# Patient Record
Sex: Male | Born: 1965 | Race: White | Hispanic: No | Marital: Married | Smoking: Current some day smoker
Health system: Southern US, Community
[De-identification: ages and names within clinical notes are randomized; demographics above are authoritative.]

## PROBLEM LIST (undated history)

## (undated) DIAGNOSIS — M549 Dorsalgia, unspecified: Secondary | ICD-10-CM

## (undated) DIAGNOSIS — Z9189 Other specified personal risk factors, not elsewhere classified: Secondary | ICD-10-CM

## (undated) DIAGNOSIS — Z95828 Presence of other vascular implants and grafts: Secondary | ICD-10-CM

## (undated) DIAGNOSIS — J301 Allergic rhinitis due to pollen: Secondary | ICD-10-CM

## (undated) DIAGNOSIS — IMO0002 Reserved for concepts with insufficient information to code with codable children: Secondary | ICD-10-CM

## (undated) DIAGNOSIS — G4700 Insomnia, unspecified: Secondary | ICD-10-CM

## (undated) DIAGNOSIS — K750 Abscess of liver: Secondary | ICD-10-CM

## (undated) HISTORY — DX: Allergic rhinitis due to pollen: J30.1

## (undated) HISTORY — DX: Insomnia, unspecified: G47.00

## (undated) HISTORY — DX: Dorsalgia, unspecified: M54.9

## (undated) HISTORY — DX: Presence of other vascular implants and grafts: Z95.828

## (undated) HISTORY — DX: Other specified personal risk factors, not elsewhere classified: Z91.89

---
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
## 2001-05-24 ENCOUNTER — Encounter: Payer: Self-pay | Admitting: Family Medicine

## 2001-05-24 ENCOUNTER — Ambulatory Visit (HOSPITAL_COMMUNITY): Admission: RE | Admit: 2001-05-24 | Discharge: 2001-05-24 | Payer: Self-pay | Admitting: Family Medicine

## 2012-08-09 ENCOUNTER — Ambulatory Visit (INDEPENDENT_AMBULATORY_CARE_PROVIDER_SITE_OTHER): Payer: 59 | Admitting: Family Medicine

## 2012-08-09 ENCOUNTER — Encounter: Payer: Self-pay | Admitting: Family Medicine

## 2012-08-09 VITALS — BP 118/70 | HR 62 | Resp 14 | Ht 68.0 in | Wt 174.2 lb

## 2012-08-09 DIAGNOSIS — G4700 Insomnia, unspecified: Secondary | ICD-10-CM

## 2012-08-09 DIAGNOSIS — J301 Allergic rhinitis due to pollen: Secondary | ICD-10-CM

## 2012-08-09 DIAGNOSIS — M549 Dorsalgia, unspecified: Secondary | ICD-10-CM | POA: Insufficient documentation

## 2012-08-09 HISTORY — DX: Insomnia, unspecified: G47.00

## 2012-08-09 HISTORY — DX: Allergic rhinitis due to pollen: J30.1

## 2012-08-09 HISTORY — DX: Dorsalgia, unspecified: M54.9

## 2012-08-09 MED ORDER — ZOLPIDEM TARTRATE 10 MG PO TABS: 10.0000 mg | ORAL_TABLET | Freq: Every evening | ORAL | Status: DC | PRN

## 2012-08-09 MED ORDER — CYCLOBENZAPRINE HCL 10 MG PO TABS: 10.0000 mg | ORAL_TABLET | Freq: Three times a day (TID) | ORAL | Status: DC | PRN

## 2012-08-09 NOTE — Progress Notes (Signed)
Nature conservation officer at [facility] [address] [place] [address] Phone: [phone] Fax: [phone]  Date:  08/09/2012   Name:  Hunter Owens   DOB:  [birth year]   MRN:  [id number] Gender: male Age: 46 y.o.  PCP:  Hannah Beat, MD    Chief Complaint: Establish Care   History of Present Illness:  Hunter Owens is a 46 y.o. 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 He also intermittently takes some Flexeril.  He travels a great deal for work, and will intermittently need some Ambien while he is on an airplane.  H/o L4-5 disc, now ack is hurting  Doing some massage therapy, did do a [employer])  Flexeril sometimes.    Patient Active Problem List  Diagnosis  . Insomnia  . Back pain  . Allergic rhinitis due to pollen    Past Medical History  Diagnosis Date  . Chicken pox   . Allergy   . Insomnia 08/09/2012  . Back pain 08/09/2012  . Allergic rhinitis due to pollen 08/09/2012    Past Surgical History  Procedure Date  . Tonsillectomy 1974  . Wisdom tooth extraction 1985    History  Substance Use Topics  . Smoking status: Former Smoker    Types: Cigarettes    Quit date: 08/09/2009  . Smokeless tobacco: Never Used  . Alcohol Use: Yes    No family history on file.  No Known Allergies  Medication list has been reviewed and updated.  Current Outpatient Prescriptions on File Prior to Visit  Medication Sig Dispense Refill  . zolpidem (AMBIEN) 10 MG tablet Take 1 tablet (10 mg total) by mouth at bedtime as needed.  30 tablet  4    Review of Systems:   GEN: No acute illnesses, no fevers, chills. GI: No n/v/d, eating normally Pulm: No SOB Interactive and getting along well at home.  Otherwise, ROS  is as per the HPI.   Physical Examination: Filed Vitals:   08/09/12 1359  BP: 118/70  Pulse: 62  Resp: 14   Filed Vitals:   08/09/12 1359  Height: 5\' 8"  (1.727 m)  Weight: 174 lb 4 oz (79.039 kg)   Body mass index is 26.49 kg/(m^2). Ideal Body Weight: Weight in (lb) to have BMI = 25: 164.1    GEN: WDWN, NAD, Non-toxic, A & O x 3 HEENT: Atraumatic, Normocephalic. Neck supple. No masses, No LAD. Ears and Nose: No external deformity. CV: RRR, No M/G/R. No JVD. No thrill. No extra heart sounds. PULM: CTA B, no wheezes, crackles, rhonchi. No retractions. No resp. distress. No accessory muscle use. EXTR: No c/c/e NEURO Normal gait.  PSYCH: Normally interactive. Conversant. Not depressed or anxious appearing.  Calm demeanor.    Assessment and Plan:  1. Insomnia   2. Back pain   3. Allergic rhinitis due to pollen    Generally doing very well. Recently had a full blood work panel done at [facility] urgent care, and this was normal. We will obtain those records.  Orders Today:  No orders of the defined types were placed in this encounter.    Medications Today: (Includes new updates added during medication reconciliation) Meds ordered this encounter  Medications  . DISCONTD: cyclobenzaprine (FLEXERIL) 5 MG tablet    Sig: Take 5  mg by mouth 3 (three) times daily as needed.  Marland Kitchen DISCONTD: zolpidem (AMBIEN) 10 MG tablet    Sig: Take 10 mg by mouth at bedtime as needed.  . cyclobenzaprine (FLEXERIL) 10 MG tablet    Sig: Take 1 tablet (10 mg total) by mouth 3 (three) times daily as needed.    Dispense:  40 tablet    Refill:  5  . zolpidem (AMBIEN) 10 MG tablet    Sig: Take 1 tablet (10 mg total) by mouth at bedtime as needed.    Dispense:  30 tablet    Refill:  4    Medications Discontinued: Medications Discontinued During This Encounter  Medication Reason  . cyclobenzaprine (FLEXERIL) 5 MG tablet Reorder  . zolpidem (AMBIEN) 10 MG tablet Reorder     Hannah Beat,  MD,

## 2012-08-13 ENCOUNTER — Telehealth: Payer: Self-pay | Admitting: Family Medicine

## 2012-08-13 ENCOUNTER — Encounter: Payer: Self-pay | Admitting: Family Medicine

## 2012-08-13 ENCOUNTER — Ambulatory Visit (INDEPENDENT_AMBULATORY_CARE_PROVIDER_SITE_OTHER): Payer: 59 | Admitting: Family Medicine

## 2012-08-13 ENCOUNTER — Ambulatory Visit (INDEPENDENT_AMBULATORY_CARE_PROVIDER_SITE_OTHER)
Admission: RE | Admit: 2012-08-13 | Discharge: 2012-08-13 | Disposition: A | Discharge status: Home / Self Care | Payer: 59 | Source: Ambulatory Visit | Attending: Family Medicine | Admitting: Family Medicine

## 2012-08-13 VITALS — BP 108/60 | HR 60 | Temp 97.8°F | Wt 174.0 lb

## 2012-08-13 DIAGNOSIS — M79609 Pain in unspecified limb: Secondary | ICD-10-CM

## 2012-08-13 DIAGNOSIS — S6000XA Contusion of unspecified finger without damage to nail, initial encounter: Secondary | ICD-10-CM

## 2012-08-13 DIAGNOSIS — S60111A Contusion of right thumb with damage to nail, initial encounter: Secondary | ICD-10-CM

## 2012-08-13 DIAGNOSIS — M79644 Pain in right finger(s): Secondary | ICD-10-CM

## 2012-08-13 NOTE — Telephone Encounter (Signed)
Can you put him on my schedule for 2:15, and I will see him then with his wife

## 2012-08-13 NOTE — Progress Notes (Signed)
Nature conservation officer at [facility] [address] [place] [address] Phone: [phone] Fax: [phone]  Date:  08/13/2012   Name:  Hunter Owens   DOB:  [birth year]   MRN:  [id number] Gender: male Age: 46 y.o.  PCP:  Hannah Beat, MD    Chief Complaint: No chief complaint on file.   History of Present Illness:  Hunter Owens is a 46 y.o. very pleasant male patient who presents with the following:  The patient presents with severe RIGHT thumb pain after having some stone fall on his thumb when he was working on a rock wall. Now is having a great deal of pain primarily on the dorsum of the thumb. He is having some difficulty and pain with palpation as well as with movement in the IP joint.  Past Medical History, Surgical History, Social History, Family History, Problem List, Medications, and Allergies have been reviewed and updated if relevant.  Current Outpatient Prescriptions on File Prior to Visit  Medication Sig Dispense Refill  . cyclobenzaprine (FLEXERIL) 10 MG tablet Take 1 tablet (10 mg total) by mouth 3 (three) times daily as needed.  40 tablet  5  . zolpidem (AMBIEN) 10 MG tablet Take 1 tablet (10 mg total) by mouth at bedtime as needed.  30 tablet  4    Review of Systems:  GEN: No fevers, chills. Nontoxic. Primarily MSK c/o today. MSK: Detailed in the HPI GI: tolerating PO intake without difficulty Neuro: No numbness, parasthesias, or tingling associated. Otherwise the pertinent positives of the ROS are noted above.    Physical Examination: There were no vitals filed for this visit. There were no vitals filed for this visit. There is no height or weight on file to calculate BMI. Ideal Body Weight:     GEN: WDWN, NAD, Non-toxic, Alert & Oriented x 3 HEENT: Atraumatic, Normocephalic.  Ears and Nose: No external deformity. EXTR: No clubbing/cyanosis/edema NEURO: Normal gait.  PSYCH: Normally interactive. Conversant. Not depressed or anxious appearing.   Calm demeanor.   RIGHT thumb with tenderness to palpation in the distal aspect, with significant bruising underneath the nail. Pain with palpation of the finger itself and with manipulation DIP joint in any direction. Otherwise all bony anatomy is nontender. Nontender in the entirety of the wrist. Nontender the scaphoid. Full range of motion at the wrist. Nontender along all metacarpals.  Assessment and Plan:  1. Pain of right thumb  DG Finger Thumb Right  2. Hematoma, subungual, thumb, right     Procedure: Thumbnail Hematoma evacuation, RIGHT Consent was obtained. The patient was prepped with alcohol. A lighter which uses a heat source to keep a paperclip until it was red hot. Then a mild amount of pressure was used to burn through the nail proximally and evacuate the hematoma which immediately felt better to the patient after the procedure. No complications.  Orders Today:  Orders Placed This Encounter  Procedures  . DG Finger Thumb Right    Standing Status: Future     Number of Occurrences: 1     Standing Expiration Date: 10/13/2013    Order Specific Question:  Reason for exam:    Answer:  trauma. rule out fx    Order Specific Question:  Preferred imaging location?    Answer:  [facility]    Medications Today: (Includes new updates added during medication reconciliation) No orders of the defined types were placed in this encounter.    Medications Discontinued: There are no discontinued medications.  Dg Finger Thumb Right  08/13/2012  *RADIOLOGY REPORT*  Clinical Data: Trauma, rule out fracture  RIGHT THUMB 2+V  Comparison: None.  Findings: Three views of the right thumb submitted.  No acute fracture or subluxation.  No radiopaque foreign body.  IMPRESSION: No acute fracture or subluxation.   Original Report Authenticated By: Natasha Mead, M.D.     Hannah Beat, MD

## 2012-08-13 NOTE — Telephone Encounter (Signed)
Caller: Makar/Patient; Patient Name: Hunter Owens; PCP: Hannah Beat [facility]); Best Callback Phone Number: [phone]; Call regarding Right Thumb injury, onset 9-8.  Thumbnail is black and purple, slightly swollen, pressure when bending at the joint.  Patient had block from retaining wall drop on Right Thumb.  Hand Injury protocol used, see in 4 hours due to severe pain with movement.  Patient is coming with Wife at 14:00 appointment with Dr Patsy Lager on 9-9, Patient would like to know if he could be seen, no availablity with Dr Patsy Lager on 9-9, Dr Ermalene Searing unavailable.  Please call Patient back.

## 2013-01-29 ENCOUNTER — Telehealth: Payer: Self-pay | Admitting: Family Medicine

## 2013-01-29 NOTE — Telephone Encounter (Signed)
Patient Information:  Caller Name: Farmer  Phone: [phone]  Patient: Hunter Owens, Hunter Owens  Gender: Male  DOB: [birth year]  Age: 47 Years  PCP: Hannah Beat (Family Practice)  Office Follow Up:  Does the office need to follow up with this patient?: Yes  Instructions For The Office: Please review, wanting Cough Rx called to pharmacy.  RN Note:  Had tried to make Appt for today but unable to do so.  Would like cough medication called to [facility].  Symptoms  Reason For Call & Symptoms: Cough started Sat 2/22 after flight to [place].  Started Zpak, Mucinex today 2/25 (had on-hand).  When coughs back hurts.  Very little sleep last night, sweaty, lot of cough.  Reviewed Health History In EMR: Yes  Reviewed Medications In EMR: Yes  Reviewed Allergies In EMR: Yes  Reviewed Surgeries / Procedures: Yes  Date of Onset of Symptoms: 01/26/2013  Treatments Tried: Cough drops, Mucinex, child's Triaminic cold Rx OTC  Treatments Tried Worked: No  Guideline(s) Used:  Cough  Disposition Per Guideline:   See Today or Tomorrow in Office  Reason For Disposition Reached:   Continuous (nonstop) coughing interferes with work or school and no improvement using cough treatment per Care Advice  Advice Given:  Cough Medicines:  OTC Cough Syrups: The most common cough suppressant in OTC cough medications is dextromethorphan. Often the letters "DM" appear in the name.  OTC Cough Drops: Cough drops can help a lot, especially for mild coughs. They reduce coughing by soothing your irritated throat and removing that tickle sensation in the back of the throat. Cough drops also have the advantage of portability - you can carry them with you.  Home Remedy - Hard Candy: Hard candy works just as well as medicine-flavored OTC cough drops. Diabetics should use sugar-free candy.  Home Remedy - Honey: This old home remedy has been shown to help decrease coughing at night. The adult dosage is 2  teaspoons (10 ml) at bedtime. Honey should not be given to infants under one year of age.  Coughing Spasms:  Drink warm fluids. Inhale warm mist (Reason: both relax the airway and loosen up the phlegm).  Suck on cough drops or hard candy to coat the irritated throat.  Prevent Dehydration:  Drink adequate liquids.  This will help soothe an irritated or dry throat and loosen up the phlegm.  Avoid Tobacco Smoke:  Smoking or being exposed to smoke makes coughs much worse.  Call Back If:  Difficulty breathing  Cough lasts more than 3 weeks  You become worse.

## 2013-01-30 NOTE — Telephone Encounter (Signed)
i am ok calling in cough syrup  Hycodan susp. 1 tsp po at night before bed prn cough  140 mL, 0 refills  -- only take at night, can make drowsy, has hydrocodone in it

## 2013-01-30 NOTE — Telephone Encounter (Signed)
rx called to pharmacy 

## 2013-06-06 ENCOUNTER — Encounter: Payer: Self-pay | Admitting: *Deleted

## 2013-06-10 ENCOUNTER — Ambulatory Visit (INDEPENDENT_AMBULATORY_CARE_PROVIDER_SITE_OTHER): Payer: 59 | Admitting: Family Medicine

## 2013-06-10 ENCOUNTER — Encounter: Payer: Self-pay | Admitting: Family Medicine

## 2013-06-10 VITALS — BP 116/80 | HR 65 | Temp 97.7°F | Ht 68.0 in | Wt 174.5 lb

## 2013-06-10 DIAGNOSIS — M5412 Radiculopathy, cervical region: Secondary | ICD-10-CM

## 2013-06-10 DIAGNOSIS — M5010 Cervical disc disorder with radiculopathy, unspecified cervical region: Secondary | ICD-10-CM

## 2013-06-10 MED ORDER — ZOLPIDEM TARTRATE 10 MG PO TABS: 10.0000 mg | ORAL_TABLET | Freq: Every evening | ORAL | Status: DC | PRN

## 2013-06-10 MED ORDER — PREDNISONE 20 MG PO TABS: ORAL_TABLET | ORAL | Status: DC

## 2013-06-10 MED ORDER — CYCLOBENZAPRINE HCL 10 MG PO TABS: 10.0000 mg | ORAL_TABLET | Freq: Three times a day (TID) | ORAL | Status: DC | PRN

## 2013-06-10 NOTE — Patient Instructions (Addendum)
F/u 4-5 weeks 

## 2013-06-10 NOTE — Progress Notes (Signed)
Nature conservation officer at [facility] [address] [place] [address] Phone: [phone] Fax: [phone]  Date:  06/10/2013   Name:  Hunter Owens   DOB:  09-Oct-1966   MRN:  [id number] Gender: male Age: 47 y.o.  Primary Physician:  Hannah Beat, MD  Evaluating MD: Hannah Beat, MD   Chief Complaint: ? pinched nerve   History of Present Illness:  Hunter Owens is a 47 y.o. pleasant patient who presents with the following:  47 yo:  R arm numb - 1 and 2 distribution. 30 sec or so and it will go away sometimes. Sleeping will not bother him. No trauma or injury. Was a little bit with the pinky.   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 He has not had any known prior cervical disc herniation. He is now having pain and altered sensation and paresthesias in the right hand and arm mostly in the 1 in 2 distribution. He is also having some neck pain. Note from a, injury, or prior neck operation. He has already tried some anti-inflammatories as well as some Flexeril.    Patient Active Problem List   Diagnosis Date Noted  . Insomnia 08/09/2012  . Back pain 08/09/2012  . Allergic rhinitis due to pollen 08/09/2012    Past Medical History  Diagnosis Date  . Chicken pox   . Allergy   . Insomnia 08/09/2012  . Back pain 08/09/2012  . Allergic rhinitis due to pollen 08/09/2012    Past Surgical History  Procedure Laterality Date  . Tonsillectomy  1974  . Wisdom tooth extraction  1985    History   Social History  . Marital Status: Married    Spouse Name: N/A    Number of Children: N/A  . Years of Education: N/A   Occupational History  . Not on file.   Social History Main Topics  . Smoking status: Former Smoker    Types: Cigarettes    Quit date: 08/09/2009  . Smokeless tobacco: Never Used  . Alcohol Use: Yes  . Drug Use: No  . Sexually Active: Not on file   Other Topics Concern  . Not on file   Social History Narrative  . No narrative on file    No family history  on file.  No Known Allergies  Medication list has been reviewed and updated.  Outpatient Prescriptions Prior to Visit  Medication Sig Dispense Refill  . cyclobenzaprine (FLEXERIL) 10 MG tablet Take 1 tablet (10 mg total) by mouth 3 (three) times daily as needed.  40 tablet  5  . zolpidem (AMBIEN) 10 MG tablet Take 1 tablet (10 mg total) by mouth at bedtime as needed.  30 tablet  4   No facility-administered medications prior to visit.    Review of Systems:   GEN: No fevers, chills. Nontoxic. Primarily MSK c/o today. MSK: Detailed in the HPI GI: tolerating PO intake without difficulty Neuro: detailed above Otherwise the pertinent positives of the ROS are noted above.    Physical Examination: BP 116/80  Pulse 65  Temp(Src) 97.7 F (36.5 C) (Oral)  Ht 5\' 8"  (1.727 m)  Wt 174 lb 8 oz (79.153 kg)  BMI 26.54 kg/m2  SpO2 98%  Ideal Body Weight: Weight in (lb) to have BMI = 25: 164.1   GEN: Well-developed,well-nourished,in no acute distress; alert,appropriate and cooperative throughout examination HEENT: Normocephalic and atraumatic without obvious abnormalities. Ears, externally no deformities PULM: Breathing comfortably in no respiratory distress EXT: No clubbing, cyanosis, or edema  PSYCH: Normally interactive. Cooperative during the interview. Pleasant. Friendly and conversant. Not anxious or depressed appearing. Normal, full affect.  CERVICAL SPINE EXAM Range of motion: Flexion, extension, lateral bending, and rotation: modest limitation only Pain with terminal motion: mild Spinous Processes: NT SCM: NT Upper paracervical muscles: mildly tender to palpation Upper traps: NT C5-T1 intact, sensation and motor, intact After examination, the patient recreates a tingling, burning, and none sensation in his arm, through the shoulder to the lateral arm and then in a first and second distribution in the distal aspect.   Assessment and Plan:  Cervical disc disorder with  radiculopathy of cervical region - Plan: Ambulatory referral to Physical Therapy  Trial of spine PT and traction. Continue Flexeril. Short prednisone taper. Range of motion.  Orders Today:  Orders Placed This Encounter  Procedures  . Ambulatory referral to Physical Therapy    Referral Priority:  Routine    Referral Type:  Physical Medicine    Referral Reason:  Specialty Services Required    Requested Specialty:  Physical Therapy    Number of Visits Requested:  1    Updated Medication List: (Includes new medications, updates to list, dose adjustments) Meds ordered this encounter  Medications  . cyclobenzaprine (FLEXERIL) 10 MG tablet    Sig: Take 1 tablet (10 mg total) by mouth 3 (three) times daily as needed.    Dispense:  40 tablet    Refill:  5  . predniSONE (DELTASONE) 20 MG tablet    Sig: 2 tab po daily x 4 days, then 1 tab po daily x 4 days    Dispense:  12 tablet    Refill:  0  . zolpidem (AMBIEN) 10 MG tablet    Sig: Take 1 tablet (10 mg total) by mouth at bedtime as needed.    Dispense:  30 tablet    Refill:  2    Medications Discontinued: Medications Discontinued During This Encounter  Medication Reason  . cyclobenzaprine (FLEXERIL) 10 MG tablet Error  . zolpidem (AMBIEN) 10 MG tablet Error      Signed, Dontarius Sheley T. Akari Defelice, MD 06/10/2013 8:36 AM

## 2013-07-19 ENCOUNTER — Encounter: Payer: Self-pay | Admitting: Radiology

## 2013-07-22 ENCOUNTER — Ambulatory Visit (INDEPENDENT_AMBULATORY_CARE_PROVIDER_SITE_OTHER): Payer: 59 | Admitting: Family Medicine

## 2013-07-22 ENCOUNTER — Encounter: Payer: Self-pay | Admitting: Family Medicine

## 2013-07-22 VITALS — BP 100/66 | HR 60 | Temp 98.3°F | Ht 68.0 in | Wt 176.5 lb

## 2013-07-22 DIAGNOSIS — M5010 Cervical disc disorder with radiculopathy, unspecified cervical region: Secondary | ICD-10-CM

## 2013-07-22 DIAGNOSIS — M5412 Radiculopathy, cervical region: Secondary | ICD-10-CM

## 2013-07-22 NOTE — Progress Notes (Signed)
[facility] [address] [place] [address] Phone: [phone] Fax: [phone]  Date:  07/22/2013   Name:  Hunter Owens   DOB:  January 20, 1966   MRN:  [id number] Gender: male Age: 47 y.o.  Primary Physician:  Hannah Beat, MD  Evaluating MD: Hannah Beat, MD   Chief Complaint: Follow-up   History of Present Illness:  Hunter Owens is a 47 y.o. pleasant patient who presents with the following:  F/u neck pain and radiculpathy:  Tingling and numbness is a lot better. Did a short round of steroids. Gradually has been doing better. Has been going to [facility]. Then started some PT, started about. Doing some PT. HEP at home.   06/10/2013 OV: R arm numb - 1 and 2 distribution. 30 sec or so and it will go away sometimes. Sleeping will not bother him. No trauma or injury. Was a little bit with the pinky.   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 He has not had any known prior cervical disc herniation. He is now having pain and altered sensation and paresthesias in the right hand and arm mostly in the 1 in 2 distribution. He is also having some neck pain. Note from a, injury, or prior neck operation. He has already tried some anti-inflammatories as well as some Flexeril.   Patient Active Problem List   Diagnosis Date Noted  . Insomnia 08/09/2012  . Back pain 08/09/2012  . Allergic rhinitis due to pollen 08/09/2012    Past Medical History  Diagnosis Date  . Chicken pox   . Allergy   . Insomnia 08/09/2012  . Back pain 08/09/2012  . Allergic rhinitis due to pollen 08/09/2012    Past Surgical History  Procedure Laterality Date  . Tonsillectomy  1974  . Wisdom tooth extraction  1985    History   Social History  . Marital Status: Married    Spouse Name: N/A    Number of Children: N/A  . Years of Education: N/A   Occupational History  . Not on file.   Social History Main Topics  . Smoking status: Former Smoker    Types: Cigarettes    Quit date:  08/09/2009  . Smokeless tobacco: Never Used  . Alcohol Use: Yes  . Drug Use: No  . Sexual Activity: Not on file   Other Topics Concern  . Not on file   Social History Narrative  . No narrative on file    No family history on file.  No Known Allergies  Medication list has been reviewed and updated.  Outpatient Prescriptions Prior to Visit  Medication Sig Dispense Refill  . cyclobenzaprine (FLEXERIL) 10 MG tablet Take 1 tablet (10 mg total) by mouth 3 (three) times daily as needed.  40 tablet  5  . zolpidem (AMBIEN) 10 MG tablet Take 1 tablet (10 mg total) by mouth at bedtime as needed.  30 tablet  2  . predniSONE (DELTASONE) 20 MG tablet 2 tab po daily x 4 days, then 1 tab po daily x 4 days  12 tablet  0   No facility-administered medications prior to visit.    Review of Systems:   GEN: No fevers, chills. Nontoxic. Primarily MSK c/o today. MSK: Detailed in the HPI GI: tolerating PO intake without difficulty Neuro: detailed above Otherwise the pertinent positives of the ROS are noted above.    Physical Examination: BP 100/66  Pulse 60  Temp(Src) 98.3 F (36.8 C) (Oral)  Ht 5'  8" (1.727 m)  Wt 176 lb 8 oz (80.06 kg)  BMI 26.84 kg/m2  Ideal Body Weight: Weight in (lb) to have BMI = 25: 164.1  GEN: Well-developed,well-nourished,in no acute distress; alert,appropriate and cooperative throughout examination HEENT: Normocephalic and atraumatic without obvious abnormalities. Ears, externally no deformities PULM: Breathing comfortably in no respiratory distress EXT: No clubbing, cyanosis, or edema PSYCH: Normally interactive. Cooperative during the interview. Pleasant. Friendly and conversant. Not anxious or depressed appearing. Normal, full affect.  CERVICAL SPINE EXAM Range of motion: Flexion, extension, lateral bending, and rotation: modest limitation only Pain with terminal motion: mild Spinous Processes: NT SCM: NT Upper paracervical muscles: mildly tender to  palpation Upper traps: NT C5-T1 intact, sensation and motor, intact   Assessment and Plan: Cervical disc disorder with radiculopathy of cervical region   Doing much better. Cont with hep, f/u prn  Signed, Zahari Xiang T. Laurieann Friddle, MD 07/22/2013 11:44 AM

## 2013-09-14 DIAGNOSIS — Z9189 Other specified personal risk factors, not elsewhere classified: Secondary | ICD-10-CM

## 2013-09-14 HISTORY — DX: Other specified personal risk factors, not elsewhere classified: Z91.89

## 2013-10-10 ENCOUNTER — Other Ambulatory Visit: Payer: Self-pay

## 2013-12-05 DIAGNOSIS — K750 Abscess of liver: Secondary | ICD-10-CM

## 2013-12-05 HISTORY — DX: Abscess of liver: K75.0

## 2013-12-09 ENCOUNTER — Encounter: Payer: Self-pay | Admitting: Internal Medicine

## 2013-12-09 ENCOUNTER — Ambulatory Visit (INDEPENDENT_AMBULATORY_CARE_PROVIDER_SITE_OTHER): Payer: 59 | Admitting: Internal Medicine

## 2013-12-09 VITALS — BP 116/72 | HR 82 | Temp 98.2°F | Wt 170.5 lb

## 2013-12-09 DIAGNOSIS — B349 Viral infection, unspecified: Secondary | ICD-10-CM

## 2013-12-09 DIAGNOSIS — B9789 Other viral agents as the cause of diseases classified elsewhere: Secondary | ICD-10-CM

## 2013-12-09 DIAGNOSIS — R52 Pain, unspecified: Secondary | ICD-10-CM

## 2013-12-09 LAB — POCT INFLUENZA A/B
INFLUENZA A, POC: NEGATIVE
Influenza B, POC: NEGATIVE

## 2013-12-09 NOTE — Patient Instructions (Signed)
Viral Infections °A virus is a type of germ. Viruses can cause: °· Minor sore throats. °· Aches and pains. °· Headaches. °· Runny nose. °· Rashes. °· Watery eyes. °· Tiredness. °· Coughs. °· Loss of appetite. °· Feeling sick to your stomach (nausea). °· Throwing up (vomiting). °· Watery poop (diarrhea). °HOME CARE  °· Only take medicines as told by your doctor. °· Drink enough water and fluids to keep your pee (urine) clear or pale yellow. Sports drinks are a good choice. °· Get plenty of rest and eat healthy. Soups and broths with crackers or rice are fine. °GET HELP RIGHT AWAY IF:  °· You have a very bad headache. °· You have shortness of breath. °· You have chest pain or neck pain. °· You have an unusual rash. °· You cannot stop throwing up. °· You have watery poop that does not stop. °· You cannot keep fluids down. °· You or your child has a temperature by mouth above 102° F (38.9° C), not controlled by medicine. °· Your baby is older than 3 months with a rectal temperature of 102° F (38.9° C) or higher. °· Your baby is 3 months old or younger with a rectal temperature of 100.4° F (38° C) or higher. °MAKE SURE YOU:  °· Understand these instructions. °· Will watch this condition. °· Will get help right away if you are not doing well or get worse. °Document Released: 11/03/2008 Document Revised: 02/13/2012 Document Reviewed: 03/29/2011 °Exit[employer]® Patient Information ©2014 [employer]. ° °

## 2013-12-09 NOTE — Progress Notes (Signed)
Pre-visit discussion using our clinic review tool. No additional management support is needed unless otherwise documented below in the visit note.  

## 2013-12-09 NOTE — Addendum Note (Signed)
Addended by: Lurlean Nanny on: 12/09/2013 12:37 PM   Modules accepted: Orders

## 2013-12-09 NOTE — Progress Notes (Signed)
HPI  Pt presents to the clinic today with c/o headache, fatigue, no energy, loss of appetite. He has had chills and body aches. He has had a unproductive cough. The worst part is the headache. He has taken Ibuprofen, Tylenol, bayer back and body, Sudafed, Delsym, Nyquil,  none of which have helped. He has not had sick contacts. He did not take the flu vaccine.  Review of Systems      Past Medical History  Diagnosis Date  . Chicken pox   . Allergy   . Insomnia 08/09/2012  . Back pain 08/09/2012  . Allergic rhinitis due to pollen 08/09/2012    History reviewed. No pertinent family history.  History   Social History  . Marital Status: Married    Spouse Name: N/A    Number of Children: N/A  . Years of Education: N/A   Occupational History  . Not on file.   Social History Main Topics  . Smoking status: Former Smoker    Types: Cigarettes    Quit date: 08/09/2009  . Smokeless tobacco: Never Used  . Alcohol Use: Yes  . Drug Use: No  . Sexual Activity: Not on file   Other Topics Concern  . Not on file   Social History Narrative  . No narrative on file    No Known Allergies   Constitutional: Positive headache, fatigue and fever. Denies abrupt weight changes.  HEENT:  Denies eye redness, eye pain, pressure behind the eyes, facial pain, nasal congestion, ear pain, ringing in the ears, wax buildup, runny nose or bloody nose. Respiratory: Positive cough. Denies difficulty breathing or shortness of breath.  Cardiovascular: Denies chest pain, chest tightness, palpitations or swelling in the hands or feet.   No other specific complaints in a complete review of systems (except as listed in HPI above).  Objective:   BP 116/72  Pulse 82  Temp(Src) 98.2 F (36.8 C) (Oral)  Wt 170 lb 8 oz (77.338 kg)  SpO2 98% Wt Readings from Last 3 Encounters:  12/09/13 170 lb 8 oz (77.338 kg)  07/22/13 176 lb 8 oz (80.06 kg)  06/10/13 174 lb 8 oz (79.153 kg)     General: Appears his  stated age, well developed, well nourished in NAD. HEENT: Head: normal shape and size; Eyes: sclera white, no icterus, conjunctiva pink, PERRLA and EOMs intact; Ears: Tm's gray and intact, normal light reflex; Nose: mucosa pink and moist, septum midline; Throat/Mouth: + PND. Teeth present, mucosa erythematous and moist, no exudate noted, no lesions or ulcerations noted.  Neck:  Neck supple, trachea midline. No massses, lumps or thyromegaly present.  Cardiovascular: Normal rate and rhythm. S1,S2 noted.  No murmur, rubs or gallops noted. No JVD or BLE edema. No carotid bruits noted. Pulmonary/Chest: Normal effort and positive vesicular breath sounds. No respiratory distress. No wheezes, rales or ronchi noted.      Assessment & Plan:   Viral illness:  Get some rest and drink plenty of water Will check Rapid Flu- negative Continue tylenol, fluids, rest.   RTC as needed or if symptoms persist.

## 2013-12-12 ENCOUNTER — Encounter: Payer: Self-pay | Admitting: Family Medicine

## 2013-12-12 ENCOUNTER — Ambulatory Visit (INDEPENDENT_AMBULATORY_CARE_PROVIDER_SITE_OTHER): Payer: 59 | Admitting: Family Medicine

## 2013-12-12 VITALS — BP 110/70 | HR 102 | Temp 99.2°F | Ht 68.0 in | Wt 170.0 lb

## 2013-12-12 DIAGNOSIS — J111 Influenza due to unidentified influenza virus with other respiratory manifestations: Secondary | ICD-10-CM

## 2013-12-12 DIAGNOSIS — R6883 Chills (without fever): Secondary | ICD-10-CM

## 2013-12-12 MED ORDER — HYDROCODONE-HOMATROPINE 5-1.5 MG/5ML PO SYRP: ORAL_SOLUTION | ORAL | Status: DC

## 2013-12-12 MED ORDER — AZITHROMYCIN 250 MG PO TABS: ORAL_TABLET | ORAL | Status: DC

## 2013-12-12 NOTE — Progress Notes (Signed)
Patient Name: Hunter Owens Date of Birth: Jul 04, 1966 Medical Record Number: [id number] Gender: male  History of Present Illness:  Hunter Owens presents with runny nose, sneezing, cough, sore throat, malaise, myalgias, arthralgia, chills, and fever.  New Years eve, did not drink and felt terrible. Only had a couple of beers and felt hung over. Got worse, and then got head and woke up with a pool and soaked and had the chills and shaking for about an hour.   Now having an irritating cough. Slept 20/24 hours for 2 hours. Flu test on Monday was negative.  Also had some gi distress. Now that has gone away. Had some soup.  Not eating much.  No nausea.   Primary issues or fevers, chills, aching, headache, burning sensation in his head and generally feeling very poor, anorexia, and weakness. He has had some pulmonary complaints with some coughing as well as some slight amount of diarrhea, which is now currently resolved.  + recent exposure to others with similar symptoms.   The patent denies sore throat as the primary complaint. Denies sthortness of breath/wheezing, otalgia, facial pain, abdominal pain, changes in bowel or bladder.  Generally feels terrible  Tmax: ? Not taken  PMH, PHS, Allergies, Problem List, Medications, Family History, and Social History have all been reviewed.  Review of Systems: as above, eating and drinking - tolerating PO. Urinating normally. No excessive vomitting or diarrhea. O/w as above.  Physical Exam:  Filed Vitals:   12/12/13 1819  BP: 110/70  Pulse: 102  Temp: 99.2 F (37.3 C)  TempSrc: Oral  Height: 5\' 8"  (1.727 m)  Weight: 170 lb (77.111 kg)  SpO2: 96%    Gen: WDWN, NAD; A & O x3, cooperative. Pleasant.Globally Non-toxic HEENT: Normocephalic and atraumatic. Throat clear, w/o exudate, R TM clear, L TM - good landmarks, No fluid present. rhinnorhea. No frontal or maxillary sinus T. MMM NECK: Anterior cervical  LAD is absent CV: RRR, No  M/G/R, cap refill <2 sec PULM: Breathing comfortably in no respiratory distress. no wheezing, crackles, rhonchi ABD: S,NT,ND,+BS. No HSM. No rebound. EXT: No c/c/e PSYCH: Friendly, good eye contact MSK: Nml gait  Results for orders placed in visit on 12/09/13  POCT INFLUENZA A/B      Result Value Range   Influenza A, POC Negative     Influenza B, POC Negative      Assessment and Plan: 1. Influenza-like illness:  In this case I am most suspicious for influenza. With the sensitivity of approximately 65 percent, nasal swab influenza screening is not a very sensitive test to rule out influenza.  He does appear quite ill, but he overall is very healthy. I'm going to give him some antibiotics to cover for superimposed pulmonary infection as well as some strong cough medication. He is scheduled to fly out to [place] on Monday.  Supportive care. Fluids. Cough medicines as needed  Anti-pyretics.  Infection control emphasized, including OOW or school until AF 24 hours.  Meds ordered this encounter  Medications  . azithromycin (ZITHROMAX Z-PAK) 250 MG tablet    Sig: Take 2 tablets (500 mg) on  Day 1,  followed by 1 tablet (250 mg) once daily on Days 2 through 5.    Dispense:  6 each    Refill:  0  . HYDROcodone-homatropine (HYCODAN) 5-1.5 MG/5ML syrup    Sig: 1 tsp po at night before bed prn cough    Dispense:  240 mL    Refill:  0  Patient Instructions: Viral illness: High fever, headache, bad cough, body aches, sore throat, sometimes nausea, vomitting, diarrhea.  Usually quick onset  TREATMENT 1. Decongestant: Sudafed (NOT IF HIGH BLOOD PRESSURE) 2. Nose Sprays: Saline nasal spray, Can use Afrin or Neosynephrine, but no more than 3 days 3. Cough Suppressants: DM portion of cough med, or Strong prescription 4. Expectorant: Liquify Secretions (Guaifenesin) 5. Example: Mucinex (expectorant) or Mucinex-D (expectorant and decongestant) 6. Take all prescribed meds 7. Rest  helpful, but move some during day 8. Breathe moist air: Humidifier, Vaporizer, or steam from shower 9. No work or school until no fever for 24 hrs WITH NO Tylenol or Ibuprofen. 11. Pneumonia on top of illness is possible, if you are doing poorly particularly if a smoker or have COPD, please let us know. 12. Wash hands and cover mouth with cough    Signed,  Gage Treiber T. Romulus Hanrahan, MD, [place] [facility] [place] [address] Phone: [phone] Fax: [phone]

## 2013-12-12 NOTE — Progress Notes (Signed)
Pre-visit discussion using our clinic review tool. No additional management support is needed unless otherwise documented below in the visit note.  

## 2013-12-12 NOTE — Patient Instructions (Signed)
INFLUENZA Viral illness: High fever, headache, bad cough, body aches, sore throat, sometimes nausea, vomitting, diarrhea.  Usually quick onset  TREATMENT 1. Decongestant: Sudafed (NOT IF HIGH BLOOD PRESSURE) 2. Nose Sprays: Saline nasal spray, Can use Afrin or Neosynephrine, but no more than 3 days 3. Cough Suppressants: DM portion of cough med, or Strong prescription 4. Expectorant: Liquify Secretions (Guaifenesin) 5. Example: Mucinex (expectorant) or Mucinex-D (expectorant and decongestant) 6. Take all prescribed meds 7. Anti-virals may be used if caught early or in high risk people. (Do NOT if pregnant, breast feeding, seizure disorder) 8. Rest helpful, but move some during day 9. Breathe moist air: Humidifier, Vaporizer, or steam from shower 10. No work or school until no fever for 24 hrs WITH NO Tylenol or Ibuprofen. 11. Pneumonia on top of Flu is possible, if you are doing poorly particularly if a smoker or have COPD, please let us know. 12. Wash hands and cover mouth with cough  

## 2013-12-13 ENCOUNTER — Telehealth: Payer: Self-pay | Admitting: Family Medicine

## 2013-12-13 NOTE — Telephone Encounter (Signed)
Relevant patient education assigned to patient using Emmi. ° °

## 2013-12-24 ENCOUNTER — Ambulatory Visit (INDEPENDENT_AMBULATORY_CARE_PROVIDER_SITE_OTHER): Payer: 59 | Admitting: Internal Medicine

## 2013-12-24 ENCOUNTER — Encounter: Payer: Self-pay | Admitting: Internal Medicine

## 2013-12-24 VITALS — BP 122/74 | HR 109 | Temp 98.8°F | Wt 164.0 lb

## 2013-12-24 DIAGNOSIS — R4182 Altered mental status, unspecified: Secondary | ICD-10-CM

## 2013-12-24 DIAGNOSIS — R634 Abnormal weight loss: Secondary | ICD-10-CM

## 2013-12-24 DIAGNOSIS — R5381 Other malaise: Secondary | ICD-10-CM

## 2013-12-24 DIAGNOSIS — T753XXA Motion sickness, initial encounter: Secondary | ICD-10-CM

## 2013-12-24 DIAGNOSIS — R5383 Other fatigue: Principal | ICD-10-CM

## 2013-12-24 MED ORDER — SCOPOLAMINE 1 MG/3DAYS TD PT72: 1.0000 | MEDICATED_PATCH | TRANSDERMAL | Status: DC

## 2013-12-24 NOTE — Progress Notes (Signed)
Subjective:    Patient ID: Hunter Owens, male    DOB: 11-Aug-1966, 48 y.o.   MRN: [id number]  HPI  Pt presents to the clinic to follow up confusion, "losing my thoughts", increased thirst. He feels no improvement since new years eve. He reports that he only had a few beers, but he woke up the next day feeling hung over. He is having a hard time describing how he feels. He just knows that he doesn't feel right, he is losing weight and he is not sure what is going on. He denies anxiety, depression.  Additionally, he is going on a trip in about 3 weeks. He does get motion sickness and request order for scopolamine patches. Review of Systems      Past Medical History  Diagnosis Date  . Chicken pox   . Allergy   . Insomnia 08/09/2012  . Back pain 08/09/2012  . Allergic rhinitis due to pollen 08/09/2012    Current Outpatient Prescriptions  Medication Sig Dispense Refill  . cyclobenzaprine (FLEXERIL) 10 MG tablet Take 10 mg by mouth 3 (three) times daily as needed for muscle spasms.      Marland Kitchen zolpidem (AMBIEN) 10 MG tablet Take 1 tablet (10 mg total) by mouth at bedtime as needed.  30 tablet  2  . HYDROcodone-homatropine (HYCODAN) 5-1.5 MG/5ML syrup 1 tsp po at night before bed prn cough  240 mL  0   No current facility-administered medications for this visit.    No Known Allergies  No family history on file.  History   Social History  . Marital Status: Married    Spouse Name: N/A    Number of Children: N/A  . Years of Education: N/A   Occupational History  . Not on file.   Social History Main Topics  . Smoking status: Current Some Day Smoker    Types: Cigarettes    Last Attempt to Quit: 08/09/2009  . Smokeless tobacco: Never Used  . Alcohol Use: Yes  . Drug Use: No  . Sexual Activity: Not on file   Other Topics Concern  . Not on file   Social History Narrative  . No narrative on file     Constitutional: Denies fever, malaise, fatigue, headache or abrupt weight  changes.  HEENT: Denies eye pain, eye redness, ear pain, ringing in the ears, wax buildup, runny nose, nasal congestion, bloody nose, or sore throat. Respiratory: Denies difficulty breathing, shortness of breath, cough or sputum production.   Cardiovascular: Denies chest pain, chest tightness, palpitations or swelling in the hands or feet.   Neurological: Denies dizziness, difficulty with memory, difficulty with speech or problems with balance and coordination.   No other specific complaints in a complete review of systems (except as listed in HPI above).  Objective:   Physical Exam   BP 122/74  Pulse 109  Temp(Src) 98.8 F (37.1 C) (Oral)  Wt 164 lb (74.39 kg)  SpO2 97% Wt Readings from Last 3 Encounters:  12/24/13 164 lb (74.39 kg)  12/12/13 170 lb (77.111 kg)  12/09/13 170 lb 8 oz (77.338 kg)    General: Appears his stated age, well developed, well nourished in NAD. HEENT: Head: normal shape and size; Eyes: sclera white, no icterus, conjunctiva pink, PERRLA and EOMs intact; Ears: Tm's gray and intact, normal light reflex; Nose: mucosa pink and moist, septum midline; Throat/Mouth: Teeth present, mucosa pink and moist, no exudate, lesions or ulcerations noted.  Neck: Normal range of motion. Neck supple, trachea midline.  No massses, lumps or thyromegaly present.  Cardiovascular: Normal rate and rhythm. S1,S2 noted.  No murmur, rubs or gallops noted. No JVD or BLE edema. No carotid bruits noted. Pulmonary/Chest: Normal effort and positive vesicular breath sounds. No respiratory distress. No wheezes, rales or ronchi noted.  Neurological: Alert and oriented. Cranial nerves II-XII intact. Coordination normal. +DTRs bilaterally.        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 If any worse, go to ER.

## 2013-12-24 NOTE — Patient Instructions (Signed)

## 2013-12-24 NOTE — Progress Notes (Signed)
Pre-visit discussion using our clinic review tool. No additional management support is needed unless otherwise documented below in the visit note.  

## 2013-12-25 ENCOUNTER — Telehealth: Payer: Self-pay

## 2013-12-25 ENCOUNTER — Telehealth: Payer: Self-pay | Admitting: Family Medicine

## 2013-12-25 DIAGNOSIS — E46 Unspecified protein-calorie malnutrition: Secondary | ICD-10-CM

## 2013-12-25 DIAGNOSIS — R17 Unspecified jaundice: Secondary | ICD-10-CM

## 2013-12-25 DIAGNOSIS — R945 Abnormal results of liver function studies: Principal | ICD-10-CM

## 2013-12-25 DIAGNOSIS — R7989 Other specified abnormal findings of blood chemistry: Secondary | ICD-10-CM

## 2013-12-25 LAB — COMPREHENSIVE METABOLIC PANEL
ALBUMIN: 2.1 g/dL — AB (ref 3.5–5.2)
ALK PHOS: 205 U/L — AB (ref 39–117)
ALT: 45 U/L (ref 0–53)
AST: 45 U/L — AB (ref 0–37)
BUN: 11 mg/dL (ref 6–23)
CO2: 26 mEq/L (ref 19–32)
CREATININE: 0.8 mg/dL (ref 0.4–1.5)
Calcium: 7.9 mg/dL — ABNORMAL LOW (ref 8.4–10.5)
Chloride: 94 mEq/L — ABNORMAL LOW (ref 96–112)
GFR: 104.86 mL/min (ref >60.00)
Glucose, Bld: 113 mg/dL — ABNORMAL HIGH (ref 70–99)
POTASSIUM: 4.8 meq/L (ref 3.5–5.1)
Sodium: 129 mEq/L — ABNORMAL LOW (ref 135–145)
Total Bilirubin: 3.2 mg/dL — ABNORMAL HIGH (ref 0.3–1.2)
Total Protein: 6.9 g/dL (ref 6.0–8.3)

## 2013-12-25 LAB — CBC WITH DIFFERENTIAL/PLATELET
BASOS ABS: 0 10*3/uL (ref 0.0–0.1)
BASOS PCT: 0.1 % (ref 0.0–3.0)
EOS ABS: 0 10*3/uL (ref 0.0–0.7)
Eosinophils Relative: 0.1 % (ref 0.0–5.0)
HCT: 37.3 % — ABNORMAL LOW (ref 39.0–52.0)
Hemoglobin: 12.5 g/dL — ABNORMAL LOW (ref 13.0–17.0)
Lymphocytes Relative: 10.8 % — ABNORMAL LOW (ref 12.0–46.0)
Lymphs Abs: 1.1 10*3/uL (ref 0.7–4.0)
MCHC: 33.6 g/dL (ref 30.0–36.0)
MCV: 92.9 fl (ref 78.0–100.0)
MONO ABS: 0.2 10*3/uL (ref 0.1–1.0)
Monocytes Relative: 2 % — ABNORMAL LOW (ref 3.0–12.0)
Neutro Abs: 9.2 10*3/uL — ABNORMAL HIGH (ref 1.4–7.7)
PLATELETS: 346 10*3/uL (ref 150.0–400.0)
RBC: 4.01 Mil/uL — AB (ref 4.22–5.81)
RDW: 12.8 % (ref 11.5–14.6)
WBC: 10.6 10*3/uL — ABNORMAL HIGH (ref 4.5–10.5)

## 2013-12-25 LAB — TSH: TSH: 1.09 u[IU]/mL (ref 0.35–5.50)

## 2013-12-25 LAB — PROLACTIN: PROLACTIN: 5.7 ng/mL (ref 2.1–17.1)

## 2013-12-25 LAB — HEMOGLOBIN A1C: HEMOGLOBIN A1C: 5.4 % (ref 4.6–6.5)

## 2013-12-25 NOTE — Telephone Encounter (Signed)
Relevant patient education assigned to patient using Emmi. ° °

## 2013-12-25 NOTE — Telephone Encounter (Signed)
Mrs Banfield left v/m; pt is coming back home today; pt will be available any time for testing on 12/26/13 or 12/27/13. Mrs Maciejewski wanted Dr Lorelei Pont advised and request cb.

## 2013-12-25 NOTE — Telephone Encounter (Signed)
Noted and ct of abd/pelvis ordered

## 2013-12-26 ENCOUNTER — Encounter (HOSPITAL_COMMUNITY): Payer: Self-pay | Admitting: Emergency Medicine

## 2013-12-26 ENCOUNTER — Ambulatory Visit (INDEPENDENT_AMBULATORY_CARE_PROVIDER_SITE_OTHER)
Admission: RE | Admit: 2013-12-26 | Discharge: 2013-12-26 | Disposition: A | Discharge status: Home / Self Care | Payer: Managed Care, Other (non HMO) | Source: Ambulatory Visit | Attending: Family Medicine | Admitting: Family Medicine

## 2013-12-26 ENCOUNTER — Inpatient Hospital Stay (HOSPITAL_COMMUNITY)
Admission: EM | Admit: 2013-12-26 | Discharge: 2013-12-27 | DRG: 441 | Disposition: A | Discharge status: Other Acute Inpatient Hospital | Payer: Managed Care, Other (non HMO) | Attending: Internal Medicine | Admitting: Internal Medicine

## 2013-12-26 ENCOUNTER — Emergency Department (HOSPITAL_COMMUNITY): Payer: Managed Care, Other (non HMO)

## 2013-12-26 DIAGNOSIS — Z79899 Other long term (current) drug therapy: Secondary | ICD-10-CM

## 2013-12-26 DIAGNOSIS — E46 Unspecified protein-calorie malnutrition: Secondary | ICD-10-CM

## 2013-12-26 DIAGNOSIS — Z8249 Family history of ischemic heart disease and other diseases of the circulatory system: Secondary | ICD-10-CM

## 2013-12-26 DIAGNOSIS — R0902 Hypoxemia: Secondary | ICD-10-CM

## 2013-12-26 DIAGNOSIS — M549 Dorsalgia, unspecified: Secondary | ICD-10-CM

## 2013-12-26 DIAGNOSIS — R7989 Other specified abnormal findings of blood chemistry: Secondary | ICD-10-CM

## 2013-12-26 DIAGNOSIS — B9689 Other specified bacterial agents as the cause of diseases classified elsewhere: Secondary | ICD-10-CM | POA: Diagnosis present

## 2013-12-26 DIAGNOSIS — R509 Fever, unspecified: Secondary | ICD-10-CM

## 2013-12-26 DIAGNOSIS — G8929 Other chronic pain: Secondary | ICD-10-CM | POA: Diagnosis present

## 2013-12-26 DIAGNOSIS — J301 Allergic rhinitis due to pollen: Secondary | ICD-10-CM

## 2013-12-26 DIAGNOSIS — R7881 Bacteremia: Secondary | ICD-10-CM

## 2013-12-26 DIAGNOSIS — IMO0002 Reserved for concepts with insufficient information to code with codable children: Secondary | ICD-10-CM | POA: Diagnosis present

## 2013-12-26 DIAGNOSIS — F05 Delirium due to known physiological condition: Secondary | ICD-10-CM

## 2013-12-26 DIAGNOSIS — F29 Unspecified psychosis not due to a substance or known physiological condition: Secondary | ICD-10-CM | POA: Diagnosis present

## 2013-12-26 DIAGNOSIS — E43 Unspecified severe protein-calorie malnutrition: Secondary | ICD-10-CM

## 2013-12-26 DIAGNOSIS — R945 Abnormal results of liver function studies: Principal | ICD-10-CM

## 2013-12-26 DIAGNOSIS — I959 Hypotension, unspecified: Secondary | ICD-10-CM | POA: Diagnosis present

## 2013-12-26 DIAGNOSIS — G4700 Insomnia, unspecified: Secondary | ICD-10-CM

## 2013-12-26 DIAGNOSIS — F17200 Nicotine dependence, unspecified, uncomplicated: Secondary | ICD-10-CM | POA: Diagnosis present

## 2013-12-26 DIAGNOSIS — K750 Abscess of liver: Secondary | ICD-10-CM

## 2013-12-26 DIAGNOSIS — E860 Dehydration: Secondary | ICD-10-CM

## 2013-12-26 DIAGNOSIS — I820 Budd-Chiari syndrome: Secondary | ICD-10-CM

## 2013-12-26 DIAGNOSIS — R17 Unspecified jaundice: Secondary | ICD-10-CM

## 2013-12-26 HISTORY — DX: Reserved for concepts with insufficient information to code with codable children: IMO0002

## 2013-12-26 HISTORY — DX: Abscess of liver: K75.0

## 2013-12-26 LAB — COMPREHENSIVE METABOLIC PANEL
ALK PHOS: 230 U/L — AB (ref 39–117)
ALT: 41 U/L (ref 0–53)
AST: 46 U/L — ABNORMAL HIGH (ref 0–37)
Albumin: 2.1 g/dL — ABNORMAL LOW (ref 3.5–5.2)
BILIRUBIN TOTAL: 2.8 mg/dL — AB (ref 0.3–1.2)
BUN: 11 mg/dL (ref 6–23)
CHLORIDE: 89 meq/L — AB (ref 96–112)
CO2: 25 mEq/L (ref 19–32)
Calcium: 8.4 mg/dL (ref 8.4–10.5)
Creatinine, Ser: 0.71 mg/dL (ref 0.50–1.35)
GFR calc non Af Amer: >90 mL/min (ref >90)
GLUCOSE: 122 mg/dL — AB (ref 70–99)
POTASSIUM: 4.3 meq/L (ref 3.7–5.3)
Sodium: 132 mEq/L — ABNORMAL LOW (ref 137–147)
Total Protein: 7.3 g/dL (ref 6.0–8.3)

## 2013-12-26 LAB — PROTIME-INR
INR: 1.01 (ref 0.00–1.49)
PROTHROMBIN TIME: 13.1 s (ref 11.6–15.2)

## 2013-12-26 LAB — CBC WITH DIFFERENTIAL/PLATELET
Basophils Absolute: 0 10*3/uL (ref 0.0–0.1)
Basophils Relative: 0 % (ref 0–1)
Eosinophils Absolute: 0 10*3/uL (ref 0.0–0.7)
Eosinophils Relative: 0 % (ref 0–5)
HCT: 39.2 % (ref 39.0–52.0)
HEMOGLOBIN: 13.6 g/dL (ref 13.0–17.0)
LYMPHS PCT: 15 % (ref 12–46)
Lymphs Abs: 1.4 10*3/uL (ref 0.7–4.0)
MCH: 31.6 pg (ref 26.0–34.0)
MCHC: 34.7 g/dL (ref 30.0–36.0)
MCV: 91.2 fL (ref 78.0–100.0)
MONOS PCT: 8 % (ref 3–12)
Monocytes Absolute: 0.8 10*3/uL (ref 0.1–1.0)
NEUTROS ABS: 7.2 10*3/uL (ref 1.7–7.7)
Neutrophils Relative %: 76 % (ref 43–77)
Platelets: 375 10*3/uL (ref 150–400)
RBC: 4.3 MIL/uL (ref 4.22–5.81)
RDW: 13 % (ref 11.5–15.5)
WBC: 9.4 10*3/uL (ref 4.0–10.5)

## 2013-12-26 LAB — CG4 I-STAT (LACTIC ACID): Lactic Acid, Venous: 0.99 mmol/L (ref 0.5–2.2)

## 2013-12-26 LAB — URINALYSIS, ROUTINE W REFLEX MICROSCOPIC
Glucose, UA: NEGATIVE mg/dL
Hgb urine dipstick: NEGATIVE
KETONES UR: NEGATIVE mg/dL
LEUKOCYTES UA: NEGATIVE
Nitrite: NEGATIVE
PH: 5 (ref 5.0–8.0)
PROTEIN: 30 mg/dL — AB
Specific Gravity, Urine: 1.015 (ref 1.005–1.030)
Urobilinogen, UA: 4 mg/dL — ABNORMAL HIGH (ref 0.0–1.0)

## 2013-12-26 LAB — URINE MICROSCOPIC-ADD ON

## 2013-12-26 LAB — AMMONIA: AMMONIA: 15 umol/L (ref 11–60)

## 2013-12-26 MED ORDER — ONDANSETRON HCL 4 MG/2ML IJ SOLN: 4.0000 mg | Freq: Four times a day (QID) | INTRAMUSCULAR | Status: DC | PRN

## 2013-12-26 MED ORDER — SODIUM CHLORIDE 0.9 % IV SOLN
INTRAVENOUS | Status: DC
  Administered 2013-12-26: 13:00:00 via INTRAVENOUS

## 2013-12-26 MED ORDER — IBUPROFEN 600 MG PO TABS
600.0000 mg | ORAL_TABLET | Freq: Four times a day (QID) | ORAL | Status: DC | PRN
  Administered 2013-12-26 – 2013-12-27 (×2): 600 mg via ORAL
  Filled 2013-12-26 (×2): qty 1

## 2013-12-26 MED ORDER — OXYCODONE HCL 5 MG PO TABS: 5.0000 mg | ORAL_TABLET | ORAL | Status: DC | PRN

## 2013-12-26 MED ORDER — ONDANSETRON HCL 4 MG PO TABS: 4.0000 mg | ORAL_TABLET | Freq: Four times a day (QID) | ORAL | Status: DC | PRN

## 2013-12-26 MED ORDER — MORPHINE SULFATE 2 MG/ML IJ SOLN
1.0000 mg | INTRAMUSCULAR | Status: DC | PRN
  Administered 2013-12-27: 1 mg via INTRAVENOUS
  Filled 2013-12-26: qty 1

## 2013-12-26 MED ORDER — SODIUM CHLORIDE 0.9 % IV SOLN
INTRAVENOUS | Status: DC
  Administered 2013-12-26 – 2013-12-27 (×2): via INTRAVENOUS

## 2013-12-26 MED ORDER — IBUPROFEN 800 MG PO TABS
800.0000 mg | ORAL_TABLET | Freq: Once | ORAL | Status: AC
  Administered 2013-12-26: 800 mg via ORAL
  Filled 2013-12-26: qty 1

## 2013-12-26 MED ORDER — HEPARIN SODIUM (PORCINE) 5000 UNIT/ML IJ SOLN
5000.0000 [IU] | Freq: Three times a day (TID) | INTRAMUSCULAR | Status: DC
  Administered 2013-12-26: 5000 [IU] via SUBCUTANEOUS
  Filled 2013-12-26 (×4): qty 1

## 2013-12-26 MED ORDER — IOHEXOL 300 MG/ML  SOLN
100.0000 mL | Freq: Once | INTRAMUSCULAR | Status: AC | PRN
  Administered 2013-12-26: 100 mL via INTRAVENOUS

## 2013-12-26 NOTE — ED Notes (Signed)
Pt reports malaise and congestion for past few weeks, went to PCP for check up and they found his liver enzymes were elevated so they ordered a CT ABd which resulted today with multiple liver lesions and pt was instructed to come to ED for admission. He denies pain, he is A&Ox4

## 2013-12-26 NOTE — ED Notes (Signed)
Supplied patients with pillows and wash clothes.  Also supplied 2 cups of ice water 2x.

## 2013-12-26 NOTE — ED Notes (Signed)
Lactic acid results called to primary nurse [place] C

## 2013-12-26 NOTE — H&P (Signed)
[facility]                                                                                    Patient Demographics  Hunter Hunter, is a 48 y.o. male  MRN: [id number]   DOB - 11/05/1966  Admit Date - 12/26/2013  Outpatient Primary MD for the patient is Hunter Loffler, MD   With History of -  Past Medical History  Diagnosis Date  . Chicken pox   . Allergy   . Insomnia 08/09/2012  . Back pain 08/09/2012  . Allergic rhinitis due to pollen 08/09/2012  . Herniated disc       Past Surgical History  Procedure Laterality Date  . Tonsillectomy  1974  . Wisdom tooth extraction  1985    in for   Chief Complaint  Patient presents with  . Elevated Hepatic Enzymes     HPI  Hunter Hunter  is a 48 y.o. male, with non-pertinent PMH presents to the ED with fever, chills, abdominal pain, confusion and malaise X 3 weeks. Pt developed abdominal pain week of Christmas 2014. Continued through to New Years which then progressed with a HA. He saw PCP and was treated with supportive care for influenza. Returned to PCP on 12/24/13 with ongoing, progressive symptoms.  LFTs were abnormal and was instructed to go to the ED for a CT scan which demonstrated 5 liver abscesses; largest one being 10 by 13 cm (See below).  In ED patient demonstrated symptoms as above. Did not appear to be in much distress. Diaphoretic from fever and was jaundice. Mildly confused and lethargic.  Review of Systems    In addition to the HPI above, patient experiences dry cough, SOB, weight loss and confusion No changes with Vision or hearing, No Chest pain,  No Nausea or Vommitting,  No Blood in stool or Urine, No dysuria,   A full 10 point Review of Systems was done, except as stated above, all other Review of Systems were negative.   Social History History  Substance Use Topics  . Smoking status: Current Some Day Smoker    Types: Cigarettes    Last Attempt to Quit: 08/09/2009  . Smokeless tobacco: Never Used  .  Alcohol Use: Yes     Comment: social     Family History Family History  Problem Relation Age of Onset  . Heart disease Mother      Prior to Admission medications   Medication Sig Start Date End Date Taking? Authorizing Provider  acetaminophen (TYLENOL) 500 MG tablet Take 1,000 mg by mouth every 6 (six) hours as needed for mild pain.   Yes Historical Provider, MD  cyclobenzaprine (FLEXERIL) 10 MG tablet Take 10 mg by mouth 3 (three) times daily as needed for muscle spasms.   Yes Historical Provider, MD  guaiFENesin (MUCINEX) 600 MG 12 hr tablet Take 600 mg by mouth daily as needed.   Yes Historical Provider, MD  HYDROcodone-homatropine [facility]) 5-1.5 MG/5ML syrup 1 tsp po at night before bed prn cough 12/12/13  Yes Spencer Copland, MD  ibuprofen (ADVIL,MOTRIN) 200 MG tablet Take 400 mg by mouth every 6 (six) hours as needed for moderate  pain.   Yes Historical Provider, MD  scopolamine (TRANSDERM-SCOP) 1.5 MG Place 1 patch (1.5 mg total) onto the skin every 3 (three) days. 12/24/13   Webb Silversmith, NP    No Known Allergies  Physical Exam  Vitals  Blood pressure 97/60, pulse 60, temperature 97.4 F (36.3 C), temperature source Oral, resp. rate 24, height 5\' 9"  (1.753 m), weight 73.936 kg (163 lb), SpO2 94.00%.   General:  lying in bed, looks stated age, in mild distress   Psych:  Normal affect and insight, Awake Alert, Oriented X 3.  Neuro:   No F.N deficits, ALL C.Nerves Intact, Strength 5/5 all 4 extremities, Sensation intact all 4 extremities,   ENT:  Bilateral sclera icterus, Conjunctivae clear, PERRL. Moist Oral Mucosa.  Respiratory:  Symmetrical Chest wall movement, Good air movement bilaterally, CTAB.  Cardiac:  RRR, No Gallops, Rubs or Murmurs, No Parasternal Heave.  Abdomen:  Positive Bowel Sounds, Abdomen Soft, Tender in epigastric and RUQ., Liver palpable near umbilicus  Skin:  No Cyanosis, jaundice  Extremities:  Good muscle tone,  joints appear normal , no  effusions, Normal ROM.   Data Review  CBC  Recent Labs Lab 12/24/13 1626 12/26/13 1250  WBC 10.6 Repeated and verified X2.* 9.4  HGB 12.5* 13.6  HCT 37.3* 39.2  PLT 346.0 375  MCV 92.9 91.2  MCH  --  31.6  MCHC 33.6 34.7  RDW 12.8 13.0  LYMPHSABS 1.1 1.4  MONOABS 0.2 0.8  EOSABS 0.0 0.0  BASOSABS 0.0 0.0   ------------------------------------------------------------------------------------------------------------------  Chemistries   Recent Labs Lab 12/24/13 1626 12/26/13 1250  NA 129* 132*  K 4.8 4.3  CL 94* 89*  CO2 26 25  GLUCOSE 113* 122*  BUN 11 11  CREATININE 0.8 0.71  CALCIUM 7.9* 8.4  AST 45* 46*  ALT 45 41  ALKPHOS 205* 230*  BILITOT 3.2* 2.8*   ------------------------------------------------------------------------------------------------------------------ estimated creatinine clearance is 112.9 ml/min (by C-G formula based on Cr of 0.71). ------------------------------------------------------------------------------------------------------------------  Recent Labs  12/24/13 1626  TSH 1.09     Coagulation profile  Recent Labs Lab 12/26/13 1250  INR 1.01   ------------------------------------------------------------------------------------------------------------------- No results found for this basename: DDIMER,  in the last 72 hours -------------------------------------------------------------------------------------------------------------------  Cardiac Enzymes No results found for this basename: CK, CKMB, TROPONINI, MYOGLOBIN,  in the last 168 hours ------------------------------------------------------------------------------------------------------------------ No components found with this basename: POCBNP,    ---------------------------------------------------------------------------------------------------------------  Urinalysis    Component Value Date/Time   COLORURINE AMBER* 12/26/2013 1334   APPEARANCEUR CLEAR  12/26/2013 1334   LABSPEC 1.015 12/26/2013 1334   PHURINE 5.0 12/26/2013 1334   GLUCOSEU NEGATIVE 12/26/2013 1334   HGBUR NEGATIVE 12/26/2013 1334   BILIRUBINUR MODERATE* 12/26/2013 1334   KETONESUR NEGATIVE 12/26/2013 1334   PROTEINUR 30* 12/26/2013 1334   UROBILINOGEN 4.0* 12/26/2013 1334   NITRITE NEGATIVE 12/26/2013 1334   LEUKOCYTESUR NEGATIVE 12/26/2013 1334    ----------------------------------------------------------------------------------------------------------------  Imaging results:   Dg Chest 2 View  12/26/2013   CLINICAL DATA:  Weakness  EXAM: CHEST  2 VIEW  COMPARISON:  None.  FINDINGS: There is mild scarring in the lateral right base. Lungs are otherwise clear. Heart size and pulmonary vascularity are normal. No adenopathy. No bone lesions.  IMPRESSION: No edema or consolidation.   Electronically Signed   By: Lowella Grip M.D.   On: 12/26/2013 13:04   Ct Abdomen Pelvis W Contrast  12/26/2013   ADDENDUM REPORT: 12/26/2013 10:13  ADDENDUM: These results were called by telephone at the time  of interpretation on 12/26/2013 at 10:10 AM to Dr. Owens Hunter , who verbally acknowledged these results.   Electronically Signed   By: Julian Hy M.D.   On: 12/26/2013 10:13   12/26/2013   CLINICAL DATA:  Weight loss, elevated LFTs, jaundice  EXAM: CT ABDOMEN AND PELVIS WITH CONTRAST  TECHNIQUE: Multidetector CT imaging of the abdomen and pelvis was performed using the standard protocol following bolus administration of intravenous contrast.  CONTRAST:  162mL OMNIPAQUE IOHEXOL 300 MG/ML  SOLN  COMPARISON:  None.  FINDINGS: Trace bilateral pleural effusions.  Minimal dependent atelectasis.  Cardiomegaly.  Small pericardial effusion.  Multiple complex/septated low-density lesions in both lobes of the liver, including:  --5.5 x 6.4 cm lesion in the anterior right hepatic dome (series 2/image 15)  --10.8 x 13.2 cm lesion predominantly in the lateral segment left hepatic lobe (series 2/image  23)  --7.1 x 8.6 cm lesion in the posterior segment right hepatic lobe (series 2/image 30)  --5.1 x 6.0 cm lesion posteriorly in the lateral segment left hepatic lobe (series 2/image 33)  --6.0 x 5.4 cm lesion inferiorly in the posterior segment right hepatic lobe (series 2/image 38)  All lesions measure just higher than simple fluid density and have a surrounding thickened wall/rim, favored to reflect hepatic abscesses. Necrotic masses (metastases) are considered less likely. Associated mild periportal edema, predominantly in the left hepatic lobe (series 2/ image 32). Associated filling defect within a branch of the right hepatic vein (series 2/image 20).  Spleen is at the upper limits of normal for size, measuring 13.6 cm in craniocaudal dimension.  Pancreas and adrenal glands are within normal limits.  Contracted gallbladder with numerous gallstones (series 2/ image 86). No associated inflammatory changes. No intrahepatic or extrahepatic ductal dilatation.  Kidneys are within normal limits.  No hydronephrosis.  No evidence of bowel obstruction. Normal appendix. Sigmoid diverticulosis, without convincing associated inflammatory changes.  No evidence of abdominal aortic aneurysm.  Trace pelvic ascites.  No suspicious abdominopelvic lymphadenopathy.  Prostate is unremarkable.  Bladder is underdistended.  Degenerative changes of the visualized thoracolumbar spine.  IMPRESSION: Multiple complex fluid density hepatic lesions, measuring up to 13.2 cm, as described above. This appearance favors hepatic abscesses, with necrotic masses (metastases) considered less likely.  Associated mild periportal edema and thrombosis within a branch of the right hepatic vein.  Associated small pericardial effusion and trace bilateral pleural effusions.  Electronically Signed: By: Julian Hy M.D. On: 12/26/2013 10:04     Assessment & Plan  Active Problems:   Liver abscess   Dehydration   Acute confusional state   Fever,  unspecified    Liver Abscesses -5 liver lesions. Largest being 10x13 cm as seen on CT -Symptoms consistent with pyogenic liver abscesses because of Sx of night sweats, fever, and weight loss -Holding ABX till obtain a deep tissue culture, blood culture done. -Have consulted general surgery -NPO after midnight for possible morning procedure by either IR or general surgery -Will continue to monitor symptoms -Ordered a AM BMP and ID will be consulted for tomorrow 12/27/13  Dehydration -Secondary to low intake over several weeks and fever -Have started IV fluids  Fever -Secondary to liver abscesses -Have ordered Ibuprofen for symptomatic relief  Confusion -Per wife, pt has been lethargic and confused past 3 weeks -Likely secondary to liver abscesses -Have ordered NH4 level  -Will continue to monitor clinically    DVT Prophylaxis - Heparin AM Labs Ordered, also please review Full Orders Family Communication:  Wife at bedside   Code Status:  Full Likely DC to  Home with wife Condition:  Stable for inpatient Time spent in minutes : 70 minutes    Stappas, [place] PA-S Lang Snow, PA-S Cameron Proud, PA-S on 12/26/2013 at 4:07 PM  Between 7am to 7pm - Pager - [phone]  After 7pm go to [email or web address] - password [email or web address]  And look for the night coverage person covering me after hours  [place]  [phone]    Addendum  Patient seen and examined, chart and data base reviewed.  I agree with the above assessment and plan.  For full details please see the above note written by the PA students.  I reviewed and addended the above note as needed.    Birdie Hopes, MD [facility] Pager: [phone] 12/26/2013, 5:02 PM

## 2013-12-26 NOTE — ED Provider Notes (Signed)
CSN: [id number]     Arrival date & time 12/26/13  1102 History   First MD Initiated Contact with Patient 12/26/13 1139     Chief Complaint  Patient presents with  . Elevated Hepatic Enzymes   (Consider location/radiation/quality/duration/timing/severity/associated sxs/prior Treatment) The history is provided by the patient.   Hunter Owens is a 48 y.o. male who is here for evaluation after an abnormal CT scan, done as an outpatient, this morning. He saw his provider, 2 days ago, for checkup and, based on abnormal labs was sent for CT scan of the abdomen today. The scan returned showing multiple hepatic abscesses. The patient has been ill for 3 weeks with general malaise, weakness, sleepiness lethargy, decreased appetite, and chills, but no fever. He was seen and treated by his doctor for nonspecific. Possible bacterial illness, with Zithromax. This did not help. He travels frequently, but has not traveled outside the country recently. He does not have any known sick contacts. He has traveled twice during this illness. He is usually very healthy, active, and has never had this before. There are no other known modifying factors.   Past Medical History  Diagnosis Date  . Chicken pox   . Allergy   . Insomnia 08/09/2012  . Back pain 08/09/2012  . Allergic rhinitis due to pollen 08/09/2012  . Herniated disc   . Abscess of liver 12/2013   Past Surgical History  Procedure Laterality Date  . Tonsillectomy  1974  . Wisdom tooth extraction  1985  . Tonsillectomy     Family History  Problem Relation Age of Onset  . Heart disease Mother    History  Substance Use Topics  . Smoking status: Current Some Day Smoker    Types: Cigarettes    Last Attempt to Quit: 08/09/2009  . Smokeless tobacco: Never Used  . Alcohol Use: Yes     Comment: social    Review of Systems  All other systems reviewed and are negative.    Allergies  Review of patient's allergies indicates no known allergies.  Home  Medications   No current outpatient prescriptions on file. BP 105/56  Pulse 93  Temp(Src) 100.3 F (37.9 C) (Rectal)  Resp 18  Ht 5\' 9"  (1.753 m)  Wt 163 lb (73.936 kg)  BMI 24.06 kg/m2  SpO2 100% Physical Exam  Nursing note and vitals reviewed. Constitutional: He is oriented to person, place, and time. He appears well-developed and well-nourished. He appears distressed (he appears uncomfortable).  HENT:  Head: Normocephalic and atraumatic.  Right Ear: External ear normal.  Left Ear: External ear normal.  Eyes: Conjunctivae and EOM are normal. Pupils are equal, round, and reactive to light.  Neck: Normal range of motion and phonation normal. Neck supple.  Cardiovascular: Normal rate, regular rhythm, normal heart sounds and intact distal pulses.   Pulmonary/Chest: Effort normal and breath sounds normal. No respiratory distress. He exhibits no bony tenderness.  Abdominal: Soft. Normal appearance. He exhibits no mass. There is tenderness (Midepigastrium, mild). There is guarding (Diffuse, mild). There is no rebound.  Musculoskeletal: Normal range of motion.  Neurological: He is alert and oriented to person, place, and time. No cranial nerve deficit or sensory deficit. He exhibits normal muscle tone. Coordination normal.  Skin: Skin is warm, dry and intact. No rash noted. No erythema. No pallor.  Psychiatric: He has a normal mood and affect. His behavior is normal. Judgment and thought content normal.    ED Course  Procedures (including critical care time)  Medications  ibuprofen (ADVIL,MOTRIN) tablet 600 mg (not administered)  heparin injection 5,000 Units (not administered)  0.9 %  sodium chloride infusion ( Intravenous New Bag/Given 12/26/13 1728)  oxyCODONE (Oxy IR/ROXICODONE) immediate release tablet 5 mg (not administered)  morphine 2 MG/ML injection 1 mg (not administered)  ondansetron (ZOFRAN) tablet 4 mg (not administered)    Or  ondansetron (ZOFRAN) injection 4 mg (not  administered)  ibuprofen (ADVIL,MOTRIN) tablet 800 mg (800 mg Oral Given 12/26/13 1311)    Patient Vitals for the past 24 hrs:  BP Temp Temp src Pulse Resp SpO2 Height Weight  12/26/13 1758 - 100.3 F (37.9 C) Rectal - - - - -  12/26/13 1540 105/56 mmHg 97.4 F (36.3 C) Oral 93 18 100 % 5\' 9"  (1.753 m) 163 lb (73.936 kg)  12/26/13 1500 97/60 mmHg - - 60 - 94 % - -  12/26/13 1432 98/58 mmHg - - 95 24 96 % - -  12/26/13 1430 99/58 mmHg - - 92 - 96 % - -  12/26/13 1408 - 97.4 F (36.3 C) Oral - - - - -  12/26/13 1345 109/59 mmHg - - 102 - 94 % - -  12/26/13 1330 107/58 mmHg - - 105 - 93 % - -  12/26/13 1300 - 103.5 F (39.7 C) Rectal - - - - -  12/26/13 1214 116/76 mmHg - - 94 22 97 % - -  12/26/13 1200 116/76 mmHg - - 89 - 100 % - -  12/26/13 1145 111/61 mmHg - - 87 - 97 % - -  12/26/13 1128 100/54 mmHg 98 F (36.7 C) Oral 94 16 96 % 5\' 9"  (1.753 m) 163 lb (73.936 kg)    1:23 PM Reevaluation with update and discussion. After initial assessment and treatment, an updated evaluation reveals fever, based on rectal temperature. He has developed hypoxia, without symptomatic dyspnea. Some chills earlier, but they're better, now.Daleen Bo L      Labs Review Labs Reviewed  COMPREHENSIVE METABOLIC PANEL - Abnormal; Notable for the following:    Sodium 132 (*)    Chloride 89 (*)    Glucose, Bld 122 (*)    Albumin 2.1 (*)    AST 46 (*)    Alkaline Phosphatase 230 (*)    Total Bilirubin 2.8 (*)    All other components within normal limits  URINALYSIS, ROUTINE W REFLEX MICROSCOPIC - Abnormal; Notable for the following:    Color, Urine AMBER (*)    Bilirubin Urine MODERATE (*)    Protein, ur 30 (*)    Urobilinogen, UA 4.0 (*)    All other components within normal limits  URINE MICROSCOPIC-ADD ON - Abnormal; Notable for the following:    Bacteria, UA FEW (*)    All other components within normal limits  URINE CULTURE  CULTURE, BLOOD (ROUTINE X 2)  CULTURE, BLOOD (ROUTINE X 2)   CBC WITH DIFFERENTIAL  PROTIME-INR  AMMONIA  BASIC METABOLIC PANEL  CG4 I-STAT (LACTIC ACID)   Imaging Review Dg Chest 2 View  12/26/2013   CLINICAL DATA:  Weakness  EXAM: CHEST  2 VIEW  COMPARISON:  None.  FINDINGS: There is mild scarring in the lateral right base. Lungs are otherwise clear. Heart size and pulmonary vascularity are normal. No adenopathy. No bone lesions.  IMPRESSION: No edema or consolidation.   Electronically Signed   By: Lowella Grip M.D.   On: 12/26/2013 13:04   Ct Abdomen Pelvis W Contrast  12/26/2013   ADDENDUM  REPORT: 12/26/2013 10:13  ADDENDUM: These results were called by telephone at the time of interpretation on 12/26/2013 at 10:10 AM to Dr. [name] , who verbally acknowledged these results.   Electronically Signed   By: Julian Hy M.D.   On: 12/26/2013 10:13   12/26/2013   CLINICAL DATA:  Weight loss, elevated LFTs, jaundice  EXAM: CT ABDOMEN AND PELVIS WITH CONTRAST  TECHNIQUE: Multidetector CT imaging of the abdomen and pelvis was performed using the standard protocol following bolus administration of intravenous contrast.  CONTRAST:  198mL OMNIPAQUE IOHEXOL 300 MG/ML  SOLN  COMPARISON:  None.  FINDINGS: Trace bilateral pleural effusions.  Minimal dependent atelectasis.  Cardiomegaly.  Small pericardial effusion.  Multiple complex/septated low-density lesions in both lobes of the liver, including:  --5.5 x 6.4 cm lesion in the anterior right hepatic dome (series 2/image 15)  --10.8 x 13.2 cm lesion predominantly in the lateral segment left hepatic lobe (series 2/image 23)  --7.1 x 8.6 cm lesion in the posterior segment right hepatic lobe (series 2/image 30)  --5.1 x 6.0 cm lesion posteriorly in the lateral segment left hepatic lobe (series 2/image 33)  --6.0 x 5.4 cm lesion inferiorly in the posterior segment right hepatic lobe (series 2/image 38)  All lesions measure just higher than simple fluid density and have a surrounding thickened wall/rim,  favored to reflect hepatic abscesses. Necrotic masses (metastases) are considered less likely. Associated mild periportal edema, predominantly in the left hepatic lobe (series 2/ image 32). Associated filling defect within a branch of the right hepatic vein (series 2/image 20).  Spleen is at the upper limits of normal for size, measuring 13.6 cm in craniocaudal dimension.  Pancreas and adrenal glands are within normal limits.  Contracted gallbladder with numerous gallstones (series 2/ image 42). No associated inflammatory changes. No intrahepatic or extrahepatic ductal dilatation.  Kidneys are within normal limits.  No hydronephrosis.  No evidence of bowel obstruction. Normal appendix. Sigmoid diverticulosis, without convincing associated inflammatory changes.  No evidence of abdominal aortic aneurysm.  Trace pelvic ascites.  No suspicious abdominopelvic lymphadenopathy.  Prostate is unremarkable.  Bladder is underdistended.  Degenerative changes of the visualized thoracolumbar spine.  IMPRESSION: Multiple complex fluid density hepatic lesions, measuring up to 13.2 cm, as described above. This appearance favors hepatic abscesses, with necrotic masses (metastases) considered less likely.  Associated mild periportal edema and thrombosis within a branch of the right hepatic vein.  Associated small pericardial effusion and trace bilateral pleural effusions.  Electronically Signed: By: Julian Hy M.D. On: 12/26/2013 10:04    EKG Interpretation    Date/Time:    Ventricular Rate:    PR Interval:    QRS Duration:   QT Interval:    QTC Calculation:   R Axis:     Text Interpretation:              MDM   1. Liver abscess   2. Fever   3. Hypoxia     Perihepatic abscesses, likely hematogenous spread from unknown source. He'll need to be admitted for further observation and treatment. He will need fluid obtained, likely by aspiration by CT guidance   Nursing Notes Reviewed/ Care Coordinated,  and agree without changes. Applicable Imaging Reviewed.  Interpretation of Laboratory Data incorporated into ED treatment    Richarda Blade, MD 12/26/13 2016

## 2013-12-26 NOTE — Consult Note (Signed)
Reason for Consult: liver abscess  Referring Physician: Dr. Lajean Manes   HPI: Hunter Owens is a 48 year old healthy male who presents to [facility] with a liver abscess.  The patient states he began feeling badly on New Year's Eve, did not drink very much and to sleep.  He woke up the next morning feeling tired, malaise.  He was seen by his PCP, had a negative flu A&B, thought to be viral. He then preceded to travel nationally as he usually does.  He once again returned to his PCP with confusion, "fogginess," weight loss, fatigue.  Blood work obtained which were abnormal he then underwent a CT of abdomen and pelvis which showed liver abscess and he was therefore referred to the ED.  He reports low grade temperatures, chills and sweats.  His symptoms are moderate in severity.  Time pattern is constant.  Coarse is worsening.  No modifying factors.  No aggravating or alleviating factors.  He denies abdominal pain, nausea or vomiting.  He reports a non productive cough.  He reports weekly travel, no international travel.  No rashes.  Social alcohol use, denies IVD use. No previous abdominal surgeries.   Past Medical History  Diagnosis Date  . Chicken pox   . Allergy   . Insomnia 08/09/2012  . Back pain 08/09/2012  . Allergic rhinitis due to pollen 08/09/2012  . Herniated disc     Past Surgical History  Procedure Laterality Date  . Tonsillectomy  1974  . Wisdom tooth extraction  1985    Family History  Problem Relation Age of Onset  . Heart disease Mother     Social History:  reports that he has been smoking Cigarettes.  He has been smoking about 0.00 packs per day. He has never used smokeless tobacco. He reports that he drinks alcohol. He reports that he does not use illicit drugs.  Allergies: No Known Allergies  Medications: Current facility-administered medications:0.9 %  sodium chloride infusion, , Intravenous, Continuous, Richarda Blade, MD, Last Rate: 125 mL/hr at 12/26/13 1313  Results for  orders placed during the hospital encounter of 12/26/13 (from the past 48 hour(s))  CBC WITH DIFFERENTIAL     Status: None   Collection Time    12/26/13 12:50 PM      Result Value Range   WBC 9.4  4.0 - 10.5 K/uL   RBC 4.30  4.22 - 5.81 MIL/uL   Hemoglobin 13.6  13.0 - 17.0 g/dL   HCT 39.2  39.0 - 52.0 %   MCV 91.2  78.0 - 100.0 fL   MCH 31.6  26.0 - 34.0 pg   MCHC 34.7  30.0 - 36.0 g/dL   RDW 13.0  11.5 - 15.5 %   Platelets 375  150 - 400 K/uL   Neutrophils Relative % 76  43 - 77 %   Neutro Abs 7.2  1.7 - 7.7 K/uL   Lymphocytes Relative 15  12 - 46 %   Lymphs Abs 1.4  0.7 - 4.0 K/uL   Monocytes Relative 8  3 - 12 %   Monocytes Absolute 0.8  0.1 - 1.0 K/uL   Eosinophils Relative 0  0 - 5 %   Eosinophils Absolute 0.0  0.0 - 0.7 K/uL   Basophils Relative 0  0 - 1 %   Basophils Absolute 0.0  0.0 - 0.1 K/uL  COMPREHENSIVE METABOLIC PANEL     Status: Abnormal   Collection Time    12/26/13 12:50 PM  Result Value Range   Sodium 132 (*) 137 - 147 mEq/L   Potassium 4.3  3.7 - 5.3 mEq/L   Chloride 89 (*) 96 - 112 mEq/L   CO2 25  19 - 32 mEq/L   Glucose, Bld 122 (*) 70 - 99 mg/dL   BUN 11  6 - 23 mg/dL   Creatinine, Ser 0.71  0.50 - 1.35 mg/dL   Calcium 8.4  8.4 - 10.5 mg/dL   Total Protein 7.3  6.0 - 8.3 g/dL   Albumin 2.1 (*) 3.5 - 5.2 g/dL   AST 46 (*) 0 - 37 U/L   ALT 41  0 - 53 U/L   Alkaline Phosphatase 230 (*) 39 - 117 U/L   Total Bilirubin 2.8 (*) 0.3 - 1.2 mg/dL   GFR calc non Af Amer >90  >90 mL/min   GFR calc Af Amer >90  >90 mL/min   Comment: (NOTE)     The eGFR has been calculated using the CKD EPI equation.     This calculation has not been validated in all clinical situations.     eGFR's persistently <90 mL/min signify possible Chronic Kidney     Disease.  PROTIME-INR     Status: None   Collection Time    12/26/13 12:50 PM      Result Value Range   Prothrombin Time 13.1  11.6 - 15.2 seconds   INR 1.01  0.00 - 1.49  URINALYSIS, ROUTINE W REFLEX  MICROSCOPIC     Status: Abnormal   Collection Time    12/26/13  1:34 PM      Result Value Range   Color, Urine AMBER (*) YELLOW   Comment: BIOCHEMICALS MAY BE AFFECTED BY COLOR   APPearance CLEAR  CLEAR   Specific Gravity, Urine 1.015  1.005 - 1.030   pH 5.0  5.0 - 8.0   Glucose, UA NEGATIVE  NEGATIVE mg/dL   Hgb urine dipstick NEGATIVE  NEGATIVE   Bilirubin Urine MODERATE (*) NEGATIVE   Ketones, ur NEGATIVE  NEGATIVE mg/dL   Protein, ur 30 (*) NEGATIVE mg/dL   Urobilinogen, UA 4.0 (*) 0.0 - 1.0 mg/dL   Nitrite NEGATIVE  NEGATIVE   Leukocytes, UA NEGATIVE  NEGATIVE  URINE MICROSCOPIC-ADD ON     Status: Abnormal   Collection Time    12/26/13  1:34 PM      Result Value Range   Squamous Epithelial / LPF RARE  RARE   WBC, UA 0-2  <3 WBC/hpf   Bacteria, UA FEW (*) RARE  CG4 I-STAT (LACTIC ACID)     Status: None   Collection Time    12/26/13  2:28 PM      Result Value Range   Lactic Acid, Venous 0.99  0.5 - 2.2 mmol/L    Dg Chest 2 View  12/26/2013   CLINICAL DATA:  Weakness  EXAM: CHEST  2 VIEW  COMPARISON:  None.  FINDINGS: There is mild scarring in the lateral right base. Lungs are otherwise clear. Heart size and pulmonary vascularity are normal. No adenopathy. No bone lesions.  IMPRESSION: No edema or consolidation.   Electronically Signed   By: Lowella Grip M.D.   On: 12/26/2013 13:04   Ct Abdomen Pelvis W Contrast  12/26/2013   ADDENDUM REPORT: 12/26/2013 10:13  ADDENDUM: These results were called by telephone at the time of interpretation on 12/26/2013 at 10:10 AM to Dr. [name] , who verbally acknowledged these results.   Electronically Signed   By:  Julian Hy M.D.   On: 12/26/2013 10:13   12/26/2013   CLINICAL DATA:  Weight loss, elevated LFTs, jaundice  EXAM: CT ABDOMEN AND PELVIS WITH CONTRAST  TECHNIQUE: Multidetector CT imaging of the abdomen and pelvis was performed using the standard protocol following bolus administration of intravenous contrast.   CONTRAST:  130m OMNIPAQUE IOHEXOL 300 MG/ML  SOLN  COMPARISON:  None.  FINDINGS: Trace bilateral pleural effusions.  Minimal dependent atelectasis.  Cardiomegaly.  Small pericardial effusion.  Multiple complex/septated low-density lesions in both lobes of the liver, including:  --5.5 x 6.4 cm lesion in the anterior right hepatic dome (series 2/image 15)  --10.8 x 13.2 cm lesion predominantly in the lateral segment left hepatic lobe (series 2/image 23)  --7.1 x 8.6 cm lesion in the posterior segment right hepatic lobe (series 2/image 30)  --5.1 x 6.0 cm lesion posteriorly in the lateral segment left hepatic lobe (series 2/image 33)  --6.0 x 5.4 cm lesion inferiorly in the posterior segment right hepatic lobe (series 2/image 38)  All lesions measure just higher than simple fluid density and have a surrounding thickened wall/rim, favored to reflect hepatic abscesses. Necrotic masses (metastases) are considered less likely. Associated mild periportal edema, predominantly in the left hepatic lobe (series 2/ image 32). Associated filling defect within a branch of the right hepatic vein (series 2/image 20).  Spleen is at the upper limits of normal for size, measuring 13.6 cm in craniocaudal dimension.  Pancreas and adrenal glands are within normal limits.  Contracted gallbladder with numerous gallstones (series 2/ image 350. No associated inflammatory changes. No intrahepatic or extrahepatic ductal dilatation.  Kidneys are within normal limits.  No hydronephrosis.  No evidence of bowel obstruction. Normal appendix. Sigmoid diverticulosis, without convincing associated inflammatory changes.  No evidence of abdominal aortic aneurysm.  Trace pelvic ascites.  No suspicious abdominopelvic lymphadenopathy.  Prostate is unremarkable.  Bladder is underdistended.  Degenerative changes of the visualized thoracolumbar spine.  IMPRESSION: Multiple complex fluid density hepatic lesions, measuring up to 13.2 cm, as described above.  This appearance favors hepatic abscesses, with necrotic masses (metastases) considered less likely.  Associated mild periportal edema and thrombosis within a branch of the right hepatic vein.  Associated small pericardial effusion and trace bilateral pleural effusions.  Electronically Signed: By: SJulian HyM.D. On: 12/26/2013 10:04    Review of Systems  Constitutional: Positive for fever, chills, weight loss, malaise/fatigue and diaphoresis.  Eyes: Negative for blurred vision, double vision, photophobia, pain, discharge and redness.  Respiratory: Negative for cough, hemoptysis, sputum production, shortness of breath and wheezing.   Cardiovascular: Negative for chest pain, palpitations, orthopnea, claudication, leg swelling and PND.  Gastrointestinal: Negative for nausea, vomiting, abdominal pain, diarrhea, constipation, blood in stool and melena.  Genitourinary: Negative for dysuria, urgency, frequency, hematuria and flank pain.  Musculoskeletal: Positive for back pain. Negative for falls, joint pain, myalgias and neck pain.  Neurological: Positive for weakness and headaches. Negative for dizziness, tingling, tremors, sensory change, speech change, focal weakness, seizures and loss of consciousness.  Psychiatric/Behavioral: Positive for memory loss. Negative for substance abuse. The patient has insomnia. The patient is not nervous/anxious.    Blood pressure 97/60, pulse 60, temperature 97.4 F (36.3 C), temperature source Oral, resp. rate 24, height 5' 9"  (1.753 m), weight 163 lb (73.936 kg), SpO2 94.00%. Physical Exam  Constitutional: He is oriented to person, place, and time. He appears well-developed and well-nourished. No distress.  HENT:  Head: Normocephalic and atraumatic.  Eyes: Conjunctivae  are normal. Pupils are equal, round, and reactive to light. Right eye exhibits no discharge. Left eye exhibits discharge. Scleral icterus is present.  Neck: Normal range of motion. Neck  supple.  Cardiovascular: Normal rate, regular rhythm, normal heart sounds and intact distal pulses.  Exam reveals no gallop and no friction rub.   No murmur heard. Respiratory: Effort normal. No respiratory distress. He has no wheezes. He has no rales. He exhibits no tenderness.  GI: Soft. Bowel sounds are normal. He exhibits no distension and no mass. There is hepatomegaly. There is no tenderness. There is no rebound and no guarding.  Musculoskeletal: He exhibits no edema and no tenderness.  Lymphadenopathy:    He has no cervical adenopathy.  Neurological: He is alert and oriented to person, place, and time.  Skin: Skin is warm and dry. No rash noted. He is not diaphoretic. No pallor.  Psychiatric: He has a normal mood and affect. His behavior is normal. Judgment and thought content normal.    Assessment/Plan: Multiple complex fluid collections/lesions measuring up to 13.2cm Malaise, fatigue, jaundice  normal white count, febrile 103.  He is hemodynamically stable.  I would keep NPO at this time, IV hydration, pain control.  Dr. Ninfa Linden will discuss with partners the appropriate plan of care for this patient.  Allisha Harter ANP-BC 12/26/2013, 4:03 PM

## 2013-12-26 NOTE — Consult Note (Signed)
I have seen and examined the patient and agree with the assessment and plans. I recommend an infectious disease consult for opinion prior to proceeding with any needle aspiration or surgery  Harriett Azar A. Ninfa Linden  MD, FACS

## 2013-12-27 ENCOUNTER — Encounter (HOSPITAL_COMMUNITY): Payer: Self-pay | Admitting: Radiology

## 2013-12-27 DIAGNOSIS — I959 Hypotension, unspecified: Secondary | ICD-10-CM

## 2013-12-27 DIAGNOSIS — G4700 Insomnia, unspecified: Secondary | ICD-10-CM

## 2013-12-27 DIAGNOSIS — B9689 Other specified bacterial agents as the cause of diseases classified elsewhere: Secondary | ICD-10-CM

## 2013-12-27 DIAGNOSIS — E860 Dehydration: Secondary | ICD-10-CM

## 2013-12-27 DIAGNOSIS — E43 Unspecified severe protein-calorie malnutrition: Secondary | ICD-10-CM | POA: Insufficient documentation

## 2013-12-27 DIAGNOSIS — K750 Abscess of liver: Principal | ICD-10-CM

## 2013-12-27 DIAGNOSIS — M549 Dorsalgia, unspecified: Secondary | ICD-10-CM

## 2013-12-27 DIAGNOSIS — J301 Allergic rhinitis due to pollen: Secondary | ICD-10-CM

## 2013-12-27 DIAGNOSIS — R0902 Hypoxemia: Secondary | ICD-10-CM

## 2013-12-27 DIAGNOSIS — R7881 Bacteremia: Secondary | ICD-10-CM | POA: Diagnosis present

## 2013-12-27 DIAGNOSIS — R17 Unspecified jaundice: Secondary | ICD-10-CM

## 2013-12-27 DIAGNOSIS — R509 Fever, unspecified: Secondary | ICD-10-CM

## 2013-12-27 LAB — COMPREHENSIVE METABOLIC PANEL
ALT: 35 U/L (ref 0–53)
AST: 51 U/L — AB (ref 0–37)
Albumin: 1.5 g/dL — ABNORMAL LOW (ref 3.5–5.2)
Alkaline Phosphatase: 192 U/L — ABNORMAL HIGH (ref 39–117)
BILIRUBIN TOTAL: 2.4 mg/dL — AB (ref 0.3–1.2)
BUN: 17 mg/dL (ref 6–23)
CHLORIDE: 96 meq/L (ref 96–112)
CO2: 25 mEq/L (ref 19–32)
Calcium: 7.5 mg/dL — ABNORMAL LOW (ref 8.4–10.5)
Creatinine, Ser: 0.68 mg/dL (ref 0.50–1.35)
GLUCOSE: 142 mg/dL — AB (ref 70–99)
Potassium: 4.1 mEq/L (ref 3.7–5.3)
SODIUM: 132 meq/L — AB (ref 137–147)
Total Protein: 5.4 g/dL — ABNORMAL LOW (ref 6.0–8.3)

## 2013-12-27 LAB — URINE CULTURE
CULTURE: NO GROWTH
Colony Count: NO GROWTH

## 2013-12-27 LAB — PROTIME-INR
INR: 1.32 (ref 0.00–1.49)
Prothrombin Time: 16.1 seconds — ABNORMAL HIGH (ref 11.6–15.2)

## 2013-12-27 MED ORDER — VANCOMYCIN HCL 10 G IV SOLR
1500.0000 mg | Freq: Two times a day (BID) | INTRAVENOUS | Status: DC
  Administered 2013-12-27: 1500 mg via INTRAVENOUS
  Filled 2013-12-27 (×2): qty 1500

## 2013-12-27 MED ORDER — DEXTROSE 5 % IV SOLN
2.0000 g | INTRAVENOUS | Status: DC
  Administered 2013-12-27: 2 g via INTRAVENOUS
  Filled 2013-12-27 (×2): qty 2

## 2013-12-27 NOTE — Progress Notes (Signed)
Patient transferred to [facility] room 8316 transported by [facility] via Biomedical scientist. Vital signs stable prior to discharge, patient was alert and oriented X 4. Denies any pain, not in any distress.

## 2013-12-27 NOTE — Progress Notes (Signed)
Report given to [facility] 

## 2013-12-27 NOTE — Progress Notes (Signed)
ANTIBIOTIC CONSULT NOTE - INITIAL  Pharmacy Consult for vancomycin Indication: hepatic abscess  No Known Allergies  Patient Measurements: Height: 5\' 9"  (175.3 cm) Weight: 163 lb (73.936 kg) IBW/kg (Calculated) : 70.7 Adjusted Body Weight:   Vital Signs: Temp: 100.4 F (38 C) (01/23 1215) Temp src: Axillary (01/23 1215) BP: 123/99 mmHg (01/23 1215) Pulse Rate: 60 (01/23 1215) Intake/Output from previous day: 01/22 0701 - 01/23 0700 In: 1273.3 [I.V.:1273.3] Out: 225 [Urine:225] Intake/Output from this shift:    Labs:  Recent Labs  12/24/13 1626 12/26/13 1250 12/27/13 0323  WBC 10.6 Repeated and verified X2.* 9.4  --   HGB 12.5* 13.6  --   PLT 346.0 375  --   CREATININE 0.8 0.71 0.68   Estimated Creatinine Clearance: 112.9 ml/min (by C-G formula based on Cr of 0.68). No results found for this basename: VANCOTROUGH, VANCOPEAK, VANCORANDOM, [place], GENTPEAK, [place], [place], [place], TOBRARND, AMIKACINPEAK, AMIKACINTROU, AMIKACIN,  in the last 72 hours   Microbiology: Recent Results (from the past 720 hour(s))  CULTURE, BLOOD (ROUTINE X 2)     Status: None   Collection Time    12/26/13 12:50 PM      Result Value Range Status   Specimen Description BLOOD RIGHT ANTECUBITAL   Final   Special Requests BOTTLES DRAWN AEROBIC AND ANAEROBIC 5CCS   Final   Culture  Setup Time     Final   Value: 12/26/2013 18:20     Performed at [facility]   Culture     Final   Value: [place] PAIRS AND CHAINS     Note: Gram Stain Report Called to,Read Back By and Verified With: CALWITHIN Z 12/27/13 1010 BY SMITHERSJ     Performed at [facility]   Report Status PENDING   Incomplete  CULTURE, BLOOD (ROUTINE X 2)     Status: None   Collection Time    12/26/13  2:08 PM      Result Value Range Status   Specimen Description BLOOD RIGHT FOREARM   Final   Special Requests BOTTLES DRAWN AEROBIC AND ANAEROBIC 5CC   Final   Culture  Setup Time      Final   Value: 12/26/2013 20:37     Performed at [facility]   Culture     Final   Value:        BLOOD CULTURE RECEIVED NO GROWTH TO DATE CULTURE WILL BE HELD FOR 5 DAYS BEFORE ISSUING A FINAL NEGATIVE REPORT     Performed at [facility]   Report Status PENDING   Incomplete    Medical History: Past Medical History  Diagnosis Date  . Chicken pox   . Allergy   . Insomnia 08/09/2012  . Back pain 08/09/2012  . Allergic rhinitis due to pollen 08/09/2012  . Herniated disc   . Abscess of liver 12/2013    Medications:  Anti-infectives   Start     Dose/Rate Route Frequency Ordered Stop   12/27/13 1400  vancomycin (VANCOCIN) 1,500 mg in sodium chloride 0.9 % 500 mL IVPB     1,500 mg 250 mL/hr over 120 Minutes Intravenous Every 12 hours 12/27/13 1242     12/27/13 1300  cefTRIAXone (ROCEPHIN) 2 g in dextrose 5 % 50 mL IVPB     2 g 100 mL/hr over 30 Minutes Intravenous Every 24 hours 12/27/13 1233       Assessment: 49 yom presented to the hospital with abdominal pain, fever, nausea and  fatigue. To start vancomycin + ceftriaxone for hepatic abscess. Tmax is 103.5 and WBC is WNL. Pt has good renal fxn with Scr of 0.68. Blood cultures are positive with 1/2 growing GPC in pairs and chains.  Vanc 1/23>> CTX 1/23>>  1/22 Blood - 1/2 GPC in pairs and chains  Goal of Therapy:  Vancomycin trough level 10-15 mcg/ml  Plan:  1. Vancomycin 1500mg  IV Q12H 2. F/u renal fxn, C&S, clinical status and trough at [facility], Rande Lawman 12/27/2013,12:42 PM

## 2013-12-27 NOTE — H&P (Signed)
Report given to [facility], [place], [place]

## 2013-12-27 NOTE — Consult Note (Addendum)
[facility]  Total days of antibiotics 0               Reason for Consult: hepatic abscess    Referring Physician: Rae Lips Principal Problem:   Liver abscess Active Problems:   Dehydration   Acute confusional state   Fever, unspecified    HPI: Hunter Owens is a 48 y.o. male in previous good state of health until 12/04/13 when noted to have fatigue, nighstweats, rigors, chills where he remembers having drenching nightsweats on new years eve. He was bedbound, with drenching sweats, chills,rigors, sleeping 20hr per day from Jan 1-3rd, missed a week of work, then saw his PCP on 1/8 for ILI constellation of symptoms including runny nose, cough, myalgias, arthralgias. At that visit, rapid flu negative, but physician was concern that he may have had flu with secondary bacterial infection. Patient prescribed azithromycin course on 12/12/13. The patient [place] He was next seen at PCP office on 1/20 where patient reports having worsening fatigue, ongoing chills, nightsweats, and #6 lb loss. Labs taken at that time showed wbc 10.6 with left shift of 87N. Plt 346. CMP showed ALP 205, tbili 2.8 and ast 46/alt41. Due to hepatic panel abn and no improvement in symptoms, abd ct ordered.Abd CT on 12/26/13 revealed numerous large complex fluid density hepatic lesions concerning for hepatic abscesses involving both lobes of liver. In addition, there is mild periportal edema and thrombosis within a branch of the right hepatic vein. Lesions measured from  5cm to 13 cm. Patient was admitted due to lab findings and clinical exam now showing him having fever of 103.27F, epigastric abdominal pain and mild guarding. He was pan cultured and evaluated by surgery and IR for drainage. Blood cultures < 24hr are showing GPC in chains in pairs. He remains having temps of 100.4 and rigors. Discussions underway to see if he needs  transfer to academic setting for possible surgical intervention.  Wife reports that they both had gastroenteritis/food poisoning from undercooked chicken in October where they were ill for several days with N/V/D  He reports that he initially had intermittent epigastric pain noticeable during thanksgiving but not until new years where he started to have chills,nightsweats, rigors. He had simple dental cleaning in mid December. No history of international travel other than [place] last year. [place] in 2008.  Past Medical History  Diagnosis Date  . Chicken pox   . Allergy   . Insomnia 08/09/2012  . Back pain 08/09/2012  . Allergic rhinitis due to pollen 08/09/2012  . Herniated disc   . Abscess of liver 12/2013    Allergies: No Known Allergies   MEDICATIONS: . cefTRIAXone (ROCEPHIN)  IV  2 g Intravenous Q24H  . heparin  5,000 Units Subcutaneous Q8H  . vancomycin  1,500 mg Intravenous Q12H    History  Substance Use Topics  . Smoking status: Current Some Day Smoker    Types: Cigarettes    Last Attempt to Quit: 08/09/2009  . Smokeless tobacco: Never Used  . 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 Heart disease Mother     Review of Systems  Constitutional: positive for fever, chills, diaphoresis, activity change, appetite change, fatigue and unexpected weight change.  HENT: Negative for congestion, sore throat, rhinorrhea, sneezing, trouble  swallowing and sinus pressure.  Eyes: Negative for photophobia and visual disturbance.  Respiratory: Negative for cough, chest tightness, shortness of breath, wheezing and stridor.  Cardiovascular: Negative for chest pain, palpitations and leg swelling.  Gastrointestinal: positive abdominal pain. Negative for nausea, vomiting, abdominal pain, diarrhea, constipation, blood in stool, abdominal distention and anal bleeding.    Genitourinary: Negative for dysuria, hematuria, flank pain and difficulty urinating.  Musculoskeletal: Negative for myalgias, back pain, joint swelling, arthralgias and gait problem.  Skin: Negative for color change, pallor, rash and wound.  Neurological: Negative for dizziness, tremors, weakness and light-headedness.  Hematological: Negative for adenopathy. Does not bruise/bleed easily.  Psychiatric/Behavioral: Negative for behavioral problems, confusion, sleep disturbance, dysphoric mood, decreased concentration and agitation.     OBJECTIVE: Temp:  [97.4 F (36.3 C)-103.5 F (39.7 C)] 100.4 F (38 C) (01/23 1215) Pulse Rate:  [60-105] 60 (01/23 1215) Resp:  [18-24] 24 (01/23 1215) BP: (86-123)/(44-99) 123/99 mmHg (01/23 1215) SpO2:  [80 %-100 %] 80 % (01/23 1215) Weight:  [163 lb (73.936 kg)] 163 lb (73.936 kg) (01/22 1540)  Constitutional: He is oriented to person, place, and time. He appears well-developed and well-nourished. No distress.diaphoretic  HENT: mild scleral icterus Mouth/Throat: Oropharynx is clear and moist. No oropharyngeal exudate.  Cardiovascular: Normal rate, regular rhythm and normal heart sounds. Exam reveals no gallop and no friction rub.  No murmur heard.  Pulmonary/Chest: Effort normal and breath sounds normal. No respiratory distress. He has no wheezes.  Abdominal: Soft. Bowel sounds are normal. mildly distension. mild tenderness in RUQ  Lymphadenopathy:  no cervical adenopathy.  Neurological: He is alert and oriented to person, place, and time.  Skin: Skin is warm and dry. No rash noted. No erythema. No splinter hemorrahges Psychiatric: a normal mood and affect. His behavior is normal.    LABS: Results for orders placed during the hospital encounter of 12/26/13 (from the past 48 hour(s))  CBC WITH DIFFERENTIAL     Status: None   Collection Time    12/26/13 12:50 PM      Result Value Range   WBC 9.4  4.0 - 10.5 K/uL   RBC 4.30  4.22 - 5.81 MIL/uL    Hemoglobin 13.6  13.0 - 17.0 g/dL   HCT 39.2  39.0 - 52.0 %   MCV 91.2  78.0 - 100.0 fL   MCH 31.6  26.0 - 34.0 pg   MCHC 34.7  30.0 - 36.0 g/dL   RDW 13.0  11.5 - 15.5 %   Platelets 375  150 - 400 K/uL   Neutrophils Relative % 76  43 - 77 %   Neutro Abs 7.2  1.7 - 7.7 K/uL   Lymphocytes Relative 15  12 - 46 %   Lymphs Abs 1.4  0.7 - 4.0 K/uL   Monocytes Relative 8  3 - 12 %   Monocytes Absolute 0.8  0.1 - 1.0 K/uL   Eosinophils Relative 0  0 - 5 %   Eosinophils Absolute 0.0  0.0 - 0.7 K/uL   Basophils Relative 0  0 - 1 %   Basophils Absolute 0.0  0.0 - 0.1 K/uL  COMPREHENSIVE METABOLIC PANEL     Status: Abnormal   Collection Time    12/26/13 12:50 PM      Result Value Range   Sodium 132 (*) 137 - 147 mEq/L   Potassium 4.3  3.7 - 5.3 mEq/L   Chloride 89 (*) 96 - 112 mEq/L   CO2 25  19 -  32 mEq/L   Glucose, Bld 122 (*) 70 - 99 mg/dL   BUN 11  6 - 23 mg/dL   Creatinine, Ser 0.71  0.50 - 1.35 mg/dL   Calcium 8.4  8.4 - 10.5 mg/dL   Total Protein 7.3  6.0 - 8.3 g/dL   Albumin 2.1 (*) 3.5 - 5.2 g/dL   AST 46 (*) 0 - 37 U/L   ALT 41  0 - 53 U/L   Alkaline Phosphatase 230 (*) 39 - 117 U/L   Total Bilirubin 2.8 (*) 0.3 - 1.2 mg/dL   GFR calc non Af Amer >90  >90 mL/min   GFR calc Af Amer >90  >90 mL/min   Comment: (NOTE)     The eGFR has been calculated using the CKD EPI equation.     This calculation has not been validated in all clinical situations.     eGFR's persistently <90 mL/min signify possible Chronic Kidney     Disease.  CULTURE, BLOOD (ROUTINE X 2)     Status: None   Collection Time    12/26/13 12:50 PM      Result Value Range   Specimen Description BLOOD RIGHT ANTECUBITAL     Special Requests BOTTLES DRAWN AEROBIC AND ANAEROBIC 5CCS     Culture  Setup Time       Value: 12/26/2013 18:20     Performed at [facility]   Culture       Value: [place] IN PAIRS AND CHAINS     Note: Gram Stain Report Called to,Read Back By and Verified With:  CALWITHIN Z 12/27/13 1010 BY SMITHERSJ     Performed at [facility]   Report Status PENDING    PROTIME-INR     Status: None   Collection Time    12/26/13 12:50 PM      Result Value Range   Prothrombin Time 13.1  11.6 - 15.2 seconds   INR 1.01  0.00 - 1.49  URINALYSIS, ROUTINE W REFLEX MICROSCOPIC     Status: Abnormal   Collection Time    12/26/13  1:34 PM      Result Value Range   Color, Urine AMBER (*) YELLOW   Comment: BIOCHEMICALS MAY BE AFFECTED BY COLOR   APPearance CLEAR  CLEAR   Specific Gravity, Urine 1.015  1.005 - 1.030   pH 5.0  5.0 - 8.0   Glucose, UA NEGATIVE  NEGATIVE mg/dL   Hgb urine dipstick NEGATIVE  NEGATIVE   Bilirubin Urine MODERATE (*) NEGATIVE   Ketones, ur NEGATIVE  NEGATIVE mg/dL   Protein, ur 30 (*) NEGATIVE mg/dL   Urobilinogen, UA 4.0 (*) 0.0 - 1.0 mg/dL   Nitrite NEGATIVE  NEGATIVE   Leukocytes, UA NEGATIVE  NEGATIVE  URINE MICROSCOPIC-ADD ON     Status: Abnormal   Collection Time    12/26/13  1:34 PM      Result Value Range   Squamous Epithelial / LPF RARE  RARE   WBC, UA 0-2  <3 WBC/hpf   Bacteria, UA FEW (*) RARE  CULTURE, BLOOD (ROUTINE X 2)     Status: None   Collection Time    12/26/13  2:08 PM      Result Value Range   Specimen Description BLOOD RIGHT FOREARM     Special Requests BOTTLES DRAWN AEROBIC AND ANAEROBIC 5CC     Culture  Setup Time       Value: 12/26/2013 20:37     Performed at  [facility]   Culture       Value:        BLOOD CULTURE RECEIVED NO GROWTH TO DATE CULTURE WILL BE HELD FOR 5 DAYS BEFORE ISSUING A FINAL NEGATIVE REPORT     Performed at [facility]   Report Status PENDING    CG4 I-STAT (LACTIC ACID)     Status: None   Collection Time    12/26/13  2:28 PM      Result Value Range   Lactic Acid, Venous 0.99  0.5 - 2.2 mmol/L  AMMONIA     Status: None   Collection Time    12/26/13  5:59 PM      Result Value Range   Ammonia 15  11 - 60 umol/L  COMPREHENSIVE METABOLIC PANEL     Status:  Abnormal   Collection Time    12/27/13  3:23 AM      Result Value Range   Sodium 132 (*) 137 - 147 mEq/L   Potassium 4.1  3.7 - 5.3 mEq/L   Chloride 96  96 - 112 mEq/L   CO2 25  19 - 32 mEq/L   Glucose, Bld 142 (*) 70 - 99 mg/dL   BUN 17  6 - 23 mg/dL   Creatinine, Ser 0.68  0.50 - 1.35 mg/dL   Calcium 7.5 (*) 8.4 - 10.5 mg/dL   Total Protein 5.4 (*) 6.0 - 8.3 g/dL   Albumin 1.5 (*) 3.5 - 5.2 g/dL   AST 51 (*) 0 - 37 U/L   ALT 35  0 - 53 U/L   Alkaline Phosphatase 192 (*) 39 - 117 U/L   Total Bilirubin 2.4 (*) 0.3 - 1.2 mg/dL   GFR calc non Af Amer >90  >90 mL/min   GFR calc Af Amer >90  >90 mL/min   Comment: (NOTE)     The eGFR has been calculated using the CKD EPI equation.     This calculation has not been validated in all clinical situations.     eGFR's persistently <90 mL/min signify possible Chronic Kidney     Disease.  PROTIME-INR     Status: Abnormal   Collection Time    12/27/13  3:23 AM      Result Value Range   Prothrombin Time 16.1 (*) 11.6 - 15.2 seconds   INR 1.32  0.00 - 1.49   MICRO: 1/22 blood cx 1 of 2: GPC in prs and cocci  IMAGING: Dg Chest 2 View  12/26/2013   CLINICAL DATA:  Weakness  EXAM: CHEST  2 VIEW  COMPARISON:  None.  FINDINGS: There is mild scarring in the lateral right base. Lungs are otherwise clear. Heart size and pulmonary vascularity are normal. No adenopathy. No bone lesions.  IMPRESSION: No edema or consolidation.   Electronically Signed   By: Lowella Grip M.D.   On: 12/26/2013 13:04   Ct Abdomen Pelvis W Contrast  12/26/2013   ADDENDUM REPORT: 12/26/2013 10:13  ADDENDUM: These results were called by telephone at the time of interpretation on 12/26/2013 at 10:10 AM to Dr. [name] , who verbally acknowledged these results.   Electronically Signed   By: Julian Hy M.D.   On: 12/26/2013 10:13   12/26/2013   CLINICAL DATA:  Weight loss, elevated LFTs, jaundice  EXAM: CT ABDOMEN AND PELVIS WITH CONTRAST  TECHNIQUE:  Multidetector CT imaging of the abdomen and pelvis was performed using the standard protocol following bolus administration of intravenous contrast.  CONTRAST:  149m OMNIPAQUE IOHEXOL 300 MG/ML  SOLN  COMPARISON:  None.  FINDINGS: Trace bilateral pleural effusions.  Minimal dependent atelectasis.  Cardiomegaly.  Small pericardial effusion.  Multiple complex/septated low-density lesions in both lobes of the liver, including:  --5.5 x 6.4 cm lesion in the anterior right hepatic dome (series 2/image 15)  --10.8 x 13.2 cm lesion predominantly in the lateral segment left hepatic lobe (series 2/image 23)  --7.1 x 8.6 cm lesion in the posterior segment right hepatic lobe (series 2/image 30)  --5.1 x 6.0 cm lesion posteriorly in the lateral segment left hepatic lobe (series 2/image 33)  --6.0 x 5.4 cm lesion inferiorly in the posterior segment right hepatic lobe (series 2/image 38)  All lesions measure just higher than simple fluid density and have a surrounding thickened wall/rim, favored to reflect hepatic abscesses. Necrotic masses (metastases) are considered less likely. Associated mild periportal edema, predominantly in the left hepatic lobe (series 2/ image 32). Associated filling defect within a branch of the right hepatic vein (series 2/image 20).  Spleen is at the upper limits of normal for size, measuring 13.6 cm in craniocaudal dimension.  Pancreas and adrenal glands are within normal limits.  Contracted gallbladder with numerous gallstones (series 2/ image 375. No associated inflammatory changes. No intrahepatic or extrahepatic ductal dilatation.  Kidneys are within normal limits.  No hydronephrosis.  No evidence of bowel obstruction. Normal appendix. Sigmoid diverticulosis, without convincing associated inflammatory changes.  No evidence of abdominal aortic aneurysm.  Trace pelvic ascites.  No suspicious abdominopelvic lymphadenopathy.  Prostate is unremarkable.  Bladder is underdistended.  Degenerative  changes of the visualized thoracolumbar spine.  IMPRESSION: Multiple complex fluid density hepatic lesions, measuring up to 13.2 cm, as described above. This appearance favors hepatic abscesses, with necrotic masses (metastases) considered less likely.  Associated mild periportal edema and thrombosis within a branch of the right hepatic vein.  Associated small pericardial effusion and trace bilateral pleural effusions.  Electronically Signed: By: SJulian HyM.D. 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) and concurrent bacteremia with gram positive cocci in chains/prs likely streptococcus species  - will start vancomycin and ceftriaxone & metronidazole - need to ask IR to place drains in the largest accessible lesions to minimize burden of disease - anticipate prolonged Iv antibiotics 6 wks +, then convert to orals until resolution of hepatic abscess - recommend to get TTE/TEE for evaluation of native valve endocarditis - please send hepatic fluid for aerobic/fungal culture & cytology to rule out malignancy - will check HIV status - repeat blood cx in 48hrs to document clearance of bacteremia. - if he is not transferred to academic center in the next few hours, please ask IR to drain sooner than later.  - would ask GI if recommendation for right hepatic vein thrombosis  Jessie Cowher B. SCarsonfor Infectious Diseases [phone]

## 2013-12-27 NOTE — Progress Notes (Signed)
Called to patient room wife stating patient is starting to have shaking episode, very anxious, SOB. Vital signs taken and recorded. O2 sat dropping O2 started , prn meds given, MD made aware.Will continue to [facility] patient.

## 2013-12-27 NOTE — H&P (Signed)
Hunter Owens is an 48 y.o. male.   Chief Complaint: pt has suffered with abd pain; fevers; nausea; fatigue since 12/05/13 Was treated for flu like sxs initially---sxs worsened CT revealed several hepatic abscesses. Pt does travel in [place]- not internationally Now scheduled for hepatic abscess aspiration/drain   HPI: chronic back pain- herniated disc  Past Medical History  Diagnosis Date  . Chicken pox   . Allergy   . Insomnia 08/09/2012  . Back pain 08/09/2012  . Allergic rhinitis due to pollen 08/09/2012  . Herniated disc   . Abscess of liver 12/2013    Past Surgical History  Procedure Laterality Date  . Tonsillectomy  1974  . Wisdom tooth extraction  1985  . Tonsillectomy      Family History  Problem Relation Age of Onset  . Heart disease Mother    Social History:  reports that he has been smoking Cigarettes.  He has been smoking about 0.00 packs per day. He has never used smokeless tobacco. He reports that he drinks alcohol. He reports that he does not use illicit drugs.  Allergies: No Known Allergies  Medications Prior to Admission  Medication Sig Dispense Refill  . acetaminophen (TYLENOL) 500 MG tablet Take 1,000 mg by mouth every 6 (six) hours as needed for mild pain.      . cyclobenzaprine (FLEXERIL) 10 MG tablet Take 10 mg by mouth 3 (three) times daily as needed for muscle spasms.      Marland Kitchen guaiFENesin (MUCINEX) 600 MG 12 hr tablet Take 600 mg by mouth daily as needed.      Marland Kitchen HYDROcodone-homatropine (HYCODAN) 5-1.5 MG/5ML syrup 1 tsp po at night before bed prn cough  240 mL  0  . ibuprofen (ADVIL,MOTRIN) 200 MG tablet Take 400 mg by mouth every 6 (six) hours as needed for moderate pain.      Marland Kitchen scopolamine (TRANSDERM-SCOP) 1.5 MG Place 1 patch (1.5 mg total) onto the skin every 3 (three) days.  10 patch  12    Results for orders placed during the hospital encounter of 12/26/13 (from the past 48 hour(s))  CBC WITH DIFFERENTIAL     Status: None   Collection Time    12/26/13  12:50 PM      Result Value Range   WBC 9.4  4.0 - 10.5 K/uL   RBC 4.30  4.22 - 5.81 MIL/uL   Hemoglobin 13.6  13.0 - 17.0 g/dL   HCT 39.2  39.0 - 52.0 %   MCV 91.2  78.0 - 100.0 fL   MCH 31.6  26.0 - 34.0 pg   MCHC 34.7  30.0 - 36.0 g/dL   RDW 13.0  11.5 - 15.5 %   Platelets 375  150 - 400 K/uL   Neutrophils Relative % 76  43 - 77 %   Neutro Abs 7.2  1.7 - 7.7 K/uL   Lymphocytes Relative 15  12 - 46 %   Lymphs Abs 1.4  0.7 - 4.0 K/uL   Monocytes Relative 8  3 - 12 %   Monocytes Absolute 0.8  0.1 - 1.0 K/uL   Eosinophils Relative 0  0 - 5 %   Eosinophils Absolute 0.0  0.0 - 0.7 K/uL   Basophils Relative 0  0 - 1 %   Basophils Absolute 0.0  0.0 - 0.1 K/uL  COMPREHENSIVE METABOLIC PANEL     Status: Abnormal   Collection Time    12/26/13 12:50 PM      Result Value Range  Sodium 132 (*) 137 - 147 mEq/L   Potassium 4.3  3.7 - 5.3 mEq/L   Chloride 89 (*) 96 - 112 mEq/L   CO2 25  19 - 32 mEq/L   Glucose, Bld 122 (*) 70 - 99 mg/dL   BUN 11  6 - 23 mg/dL   Creatinine, Ser 0.71  0.50 - 1.35 mg/dL   Calcium 8.4  8.4 - 10.5 mg/dL   Total Protein 7.3  6.0 - 8.3 g/dL   Albumin 2.1 (*) 3.5 - 5.2 g/dL   AST 46 (*) 0 - 37 U/L   ALT 41  0 - 53 U/L   Alkaline Phosphatase 230 (*) 39 - 117 U/L   Total Bilirubin 2.8 (*) 0.3 - 1.2 mg/dL   GFR calc non Af Amer >90  >90 mL/min   GFR calc Af Amer >90  >90 mL/min   Comment: (NOTE)     The eGFR has been calculated using the CKD EPI equation.     This calculation has not been validated in all clinical situations.     eGFR's persistently <90 mL/min signify possible Chronic Kidney     Disease.  PROTIME-INR     Status: None   Collection Time    12/26/13 12:50 PM      Result Value Range   Prothrombin Time 13.1  11.6 - 15.2 seconds   INR 1.01  0.00 - 1.49  URINALYSIS, ROUTINE W REFLEX MICROSCOPIC     Status: Abnormal   Collection Time    12/26/13  1:34 PM      Result Value Range   Color, Urine AMBER (*) YELLOW   Comment: BIOCHEMICALS MAY BE  AFFECTED BY COLOR   APPearance CLEAR  CLEAR   Specific Gravity, Urine 1.015  1.005 - 1.030   pH 5.0  5.0 - 8.0   Glucose, UA NEGATIVE  NEGATIVE mg/dL   Hgb urine dipstick NEGATIVE  NEGATIVE   Bilirubin Urine MODERATE (*) NEGATIVE   Ketones, ur NEGATIVE  NEGATIVE mg/dL   Protein, ur 30 (*) NEGATIVE mg/dL   Urobilinogen, UA 4.0 (*) 0.0 - 1.0 mg/dL   Nitrite NEGATIVE  NEGATIVE   Leukocytes, UA NEGATIVE  NEGATIVE  URINE MICROSCOPIC-ADD ON     Status: Abnormal   Collection Time    12/26/13  1:34 PM      Result Value Range   Squamous Epithelial / LPF RARE  RARE   WBC, UA 0-2  <3 WBC/hpf   Bacteria, UA FEW (*) RARE  CG4 I-STAT (LACTIC ACID)     Status: None   Collection Time    12/26/13  2:28 PM      Result Value Range   Lactic Acid, Venous 0.99  0.5 - 2.2 mmol/L  AMMONIA     Status: None   Collection Time    12/26/13  5:59 PM      Result Value Range   Ammonia 15  11 - 60 umol/L  COMPREHENSIVE METABOLIC PANEL     Status: Abnormal   Collection Time    12/27/13  3:23 AM      Result Value Range   Sodium 132 (*) 137 - 147 mEq/L   Potassium 4.1  3.7 - 5.3 mEq/L   Chloride 96  96 - 112 mEq/L   CO2 25  19 - 32 mEq/L   Glucose, Bld 142 (*) 70 - 99 mg/dL   BUN 17  6 - 23 mg/dL   Creatinine, Ser 0.68  0.50 - 1.35  mg/dL   Calcium 7.5 (*) 8.4 - 10.5 mg/dL   Total Protein 5.4 (*) 6.0 - 8.3 g/dL   Albumin 1.5 (*) 3.5 - 5.2 g/dL   AST 51 (*) 0 - 37 U/L   ALT 35  0 - 53 U/L   Alkaline Phosphatase 192 (*) 39 - 117 U/L   Total Bilirubin 2.4 (*) 0.3 - 1.2 mg/dL   GFR calc non Af Amer >90  >90 mL/min   GFR calc Af Amer >90  >90 mL/min   Comment: (NOTE)     The eGFR has been calculated using the CKD EPI equation.     This calculation has not been validated in all clinical situations.     eGFR's persistently <90 mL/min signify possible Chronic Kidney     Disease.  PROTIME-INR     Status: Abnormal   Collection Time    12/27/13  3:23 AM      Result Value Range   Prothrombin Time 16.1 (*)  11.6 - 15.2 seconds   INR 1.32  0.00 - 1.49   Dg Chest 2 View  12/26/2013   CLINICAL DATA:  Weakness  EXAM: CHEST  2 VIEW  COMPARISON:  None.  FINDINGS: There is mild scarring in the lateral right base. Lungs are otherwise clear. Heart size and pulmonary vascularity are normal. No adenopathy. No bone lesions.  IMPRESSION: No edema or consolidation.   Electronically Signed   By: Lowella Grip M.D.   On: 12/26/2013 13:04   Ct Abdomen Pelvis W Contrast  12/26/2013   ADDENDUM REPORT: 12/26/2013 10:13  ADDENDUM: These results were called by telephone at the time of interpretation on 12/26/2013 at 10:10 AM to Dr. [name] , who verbally acknowledged these results.   Electronically Signed   By: Julian Hy M.D.   On: 12/26/2013 10:13   12/26/2013   CLINICAL DATA:  Weight loss, elevated LFTs, jaundice  EXAM: CT ABDOMEN AND PELVIS WITH CONTRAST  TECHNIQUE: Multidetector CT imaging of the abdomen and pelvis was performed using the standard protocol following bolus administration of intravenous contrast.  CONTRAST:  1110mL OMNIPAQUE IOHEXOL 300 MG/ML  SOLN  COMPARISON:  None.  FINDINGS: Trace bilateral pleural effusions.  Minimal dependent atelectasis.  Cardiomegaly.  Small pericardial effusion.  Multiple complex/septated low-density lesions in both lobes of the liver, including:  --5.5 x 6.4 cm lesion in the anterior right hepatic dome (series 2/image 15)  --10.8 x 13.2 cm lesion predominantly in the lateral segment left hepatic lobe (series 2/image 23)  --7.1 x 8.6 cm lesion in the posterior segment right hepatic lobe (series 2/image 30)  --5.1 x 6.0 cm lesion posteriorly in the lateral segment left hepatic lobe (series 2/image 33)  --6.0 x 5.4 cm lesion inferiorly in the posterior segment right hepatic lobe (series 2/image 38)  All lesions measure just higher than simple fluid density and have a surrounding thickened wall/rim, favored to reflect hepatic abscesses. Necrotic masses (metastases) are  considered less likely. Associated mild periportal edema, predominantly in the left hepatic lobe (series 2/ image 32). Associated filling defect within a branch of the right hepatic vein (series 2/image 20).  Spleen is at the upper limits of normal for size, measuring 13.6 cm in craniocaudal dimension.  Pancreas and adrenal glands are within normal limits.  Contracted gallbladder with numerous gallstones (series 2/ image 41). No associated inflammatory changes. No intrahepatic or extrahepatic ductal dilatation.  Kidneys are within normal limits.  No hydronephrosis.  No evidence of bowel obstruction.  Normal appendix. Sigmoid diverticulosis, without convincing associated inflammatory changes.  No evidence of abdominal aortic aneurysm.  Trace pelvic ascites.  No suspicious abdominopelvic lymphadenopathy.  Prostate is unremarkable.  Bladder is underdistended.  Degenerative changes of the visualized thoracolumbar spine.  IMPRESSION: Multiple complex fluid density hepatic lesions, measuring up to 13.2 cm, as described above. This appearance favors hepatic abscesses, with necrotic masses (metastases) considered less likely.  Associated mild periportal edema and thrombosis within a branch of the right hepatic vein.  Associated small pericardial effusion and trace bilateral pleural effusions.  Electronically Signed: By: Julian Hy M.D. On: 12/26/2013 10:04    Review of Systems  Constitutional: Positive for fever, weight loss and malaise/fatigue.  Respiratory: Negative for shortness of breath.   Cardiovascular: Negative for chest pain.  Gastrointestinal: Positive for nausea and abdominal pain.  Neurological: Positive for weakness. Negative for dizziness and headaches.  Psychiatric/Behavioral: Positive for substance abuse.       Smoker    Blood pressure 94/52, pulse 73, temperature 97.4 F (36.3 C), temperature source Oral, resp. rate 22, height 5' 9" (1.753 m), weight 163 lb (73.936 kg), SpO2  97.00%. Physical Exam  Constitutional: He is oriented to person, place, and time. He appears well-nourished.  Cardiovascular: Normal rate, regular rhythm and normal heart sounds.   No murmur heard. Respiratory: Effort normal and breath sounds normal. He has no wheezes.  GI: Soft. Bowel sounds are normal. There is no tenderness.  Musculoskeletal: Normal range of motion.  Neurological: He is alert and oriented to person, place, and time.  Skin: Skin is warm and dry.  Psychiatric: He has a normal mood and affect. His behavior is normal. Judgment and thought content normal.     Assessment/Plan Hepatic lever abscesses Scheduled now for at least one if not more abscess drains to be placed Pt aware of procedure benefits and risks and agreeable to proceed Consent signed and in chart  Juanita Devincent A 12/27/2013, 8:43 AM

## 2013-12-27 NOTE — Progress Notes (Signed)
MD on rounds notified about the positive blood culture .

## 2013-12-27 NOTE — Progress Notes (Signed)
Family requesting transfer to tertiary care which is totally appropriate given situation

## 2013-12-27 NOTE — Discharge Summary (Addendum)
Physician Discharge Summary  Nandan Bollmann [id number] DOB: 1966/02/01 DOA: 12/26/2013  PCP: [name], MD  Admit date: 12/26/2013 Discharge date: 12/27/2013  Time spent: 30 minutes  Recommendations for Outpatient Follow-up:  1. To be transferred to [facility], accepting doctor is Dr. Haskel Khan.  Discharge Diagnoses:  Principal Problem:   Liver abscess Active Problems:   Dehydration   Acute confusional state   Fever, unspecified   Discharge Condition: Fair  Diet recommendation: N.p.o.  Filed Weights   12/26/13 1128 12/26/13 1540  Weight: 73.936 kg (163 lb) 73.936 kg (163 lb)    History of present illness:  Hunter Owens is a 48 y.o. male, with non-pertinent PMH presents to the ED with fever, chills, abdominal pain, confusion and malaise X 3 weeks. Pt developed abdominal pain week of Christmas 2014. Continued through to New Years which then progressed with a HA. He saw PCP and was treated with supportive care for influenza. Returned to PCP on 12/24/13 with ongoing, progressive symptoms. LFTs were abnormal and was instructed to go to the ED for a CT scan which demonstrated 5 liver abscesses; largest one being 10 by 13 cm (See below).  In ED patient demonstrated symptoms as above. Did not appear to be in much distress. Diaphoretic from fever and was jaundice. Mildly confused and lethargic.  Hospital Course:   1. Liver abscesses: Patient presents to the hospital with subacute symptoms of fever, chills, abdominal pain malaise and slight confusion for about 3 weeks. Routine blood work showed transaminitis, this is followed up by a CT scan which showed multiple complex cystic lesions in the liver consistent with abscesses. Along with patient other symptoms including weight loss, night sweats and fever these are likely pyogenic liver abscesses.  2. Gram-positive cocci bacteremia: So far 1/2 the culture is positive for GPC is in clusters and chains, initially antibiotics were held until  some type of drainage will be done for deep tissue culture, patient started to develop another fever, discussed with infectious disease and patient given a dose of vancomycin and Rocephin prior to transfer. Blood cultures will be available in [facility] through care-everywhere.  3. Dehydration: Secondary to low intake over several weeks and fever, start IV fluids.  4. Confusion: Per wife patient had episodes of confusion lethargy in the past 2 weeks, this is likely secondary to his liver abscesses GPC bacteremia, if confusion continues patient will need CT/MRI of the brain to rule out septic metastasis in the brain.  5. Right hepatic vein thrombosis: Likely acute pylephlebitis from  Procedures:  None  Consultations:  General surgery  Discharge Exam: Filed Vitals:   12/27/13 1215  BP: 123/99  Pulse: 60  Temp: 100.4 F (38 C)  Resp: 24   General: Alert and awake, oriented x3, not in any acute distress. HEENT: anicteric sclera, pupils reactive to light and accommodation, EOMI CVS: S1-S2 clear, no murmur rubs or gallops Chest: clear to auscultation bilaterally, no wheezing, rales or rhonchi Abdomen: soft nontender, nondistended, normal bowel sounds, no organomegaly Extremities: no cyanosis, clubbing or edema noted bilaterally Neuro: Cranial nerves II-XII intact, no focal neurological deficits  Discharge Instructions  Discharge Orders   Future Orders Complete By Expires   Increase activity slowly  As directed        Medication List    STOP taking these medications       acetaminophen 500 MG tablet  Commonly known as:  TYLENOL     cyclobenzaprine 10 MG tablet  Commonly known as:  FLEXERIL     guaiFENesin 600 MG 12 hr tablet  Commonly known as:  MUCINEX     HYDROcodone-homatropine 5-1.5 MG/5ML syrup  Commonly known as:  HYCODAN     ibuprofen 200 MG tablet  Commonly known as:  ADVIL,MOTRIN     scopolamine 1.5 MG  Commonly known as:  TRANSDERM-SCOP        No Known Allergies    The results of significant diagnostics from this hospitalization (including imaging, microbiology, ancillary and laboratory) are listed below for reference.    Significant Diagnostic Studies: Dg Chest 2 View  12/26/2013   CLINICAL DATA:  Weakness  EXAM: CHEST  2 VIEW  COMPARISON:  None.  FINDINGS: There is mild scarring in the lateral right base. Lungs are otherwise clear. Heart size and pulmonary vascularity are normal. No adenopathy. No bone lesions.  IMPRESSION: No edema or consolidation.   Electronically Signed   By: Lowella Grip M.D.   On: 12/26/2013 13:04   Ct Abdomen Pelvis W Contrast  12/26/2013   ADDENDUM REPORT: 12/26/2013 10:13  ADDENDUM: These results were called by telephone at the time of interpretation on 12/26/2013 at 10:10 AM to Dr. [name] , who verbally acknowledged these results.   Electronically Signed   By: Julian Hy M.D.   On: 12/26/2013 10:13   12/26/2013   CLINICAL DATA:  Weight loss, elevated LFTs, jaundice  EXAM: CT ABDOMEN AND PELVIS WITH CONTRAST  TECHNIQUE: Multidetector CT imaging of the abdomen and pelvis was performed using the standard protocol following bolus administration of intravenous contrast.  CONTRAST:  118mL OMNIPAQUE IOHEXOL 300 MG/ML  SOLN  COMPARISON:  None.  FINDINGS: Trace bilateral pleural effusions.  Minimal dependent atelectasis.  Cardiomegaly.  Small pericardial effusion.  Multiple complex/septated low-density lesions in both lobes of the liver, including:  --5.5 x 6.4 cm lesion in the anterior right hepatic dome (series 2/image 15)  --10.8 x 13.2 cm lesion predominantly in the lateral segment left hepatic lobe (series 2/image 23)  --7.1 x 8.6 cm lesion in the posterior segment right hepatic lobe (series 2/image 30)  --5.1 x 6.0 cm lesion posteriorly in the lateral segment left hepatic lobe (series 2/image 33)  --6.0 x 5.4 cm lesion inferiorly in the posterior segment right hepatic lobe (series 2/image 38)   All lesions measure just higher than simple fluid density and have a surrounding thickened wall/rim, favored to reflect hepatic abscesses. Necrotic masses (metastases) are considered less likely. Associated mild periportal edema, predominantly in the left hepatic lobe (series 2/ image 32). Associated filling defect within a branch of the right hepatic vein (series 2/image 20).  Spleen is at the upper limits of normal for size, measuring 13.6 cm in craniocaudal dimension.  Pancreas and adrenal glands are within normal limits.  Contracted gallbladder with numerous gallstones (series 2/ image 65). No associated inflammatory changes. No intrahepatic or extrahepatic ductal dilatation.  Kidneys are within normal limits.  No hydronephrosis.  No evidence of bowel obstruction. Normal appendix. Sigmoid diverticulosis, without convincing associated inflammatory changes.  No evidence of abdominal aortic aneurysm.  Trace pelvic ascites.  No suspicious abdominopelvic lymphadenopathy.  Prostate is unremarkable.  Bladder is underdistended.  Degenerative changes of the visualized thoracolumbar spine.  IMPRESSION: Multiple complex fluid density hepatic lesions, measuring up to 13.2 cm, as described above. This appearance favors hepatic abscesses, with necrotic masses (metastases) considered less likely.  Associated mild periportal edema and thrombosis within a branch of the right hepatic vein.  Associated small pericardial effusion  and trace bilateral pleural effusions.  Electronically Signed: By: Julian Hy M.D. On: 12/26/2013 10:04    Microbiology: Recent Results (from the past 240 hour(s))  CULTURE, BLOOD (ROUTINE X 2)     Status: None   Collection Time    12/26/13 12:50 PM      Result Value Range Status   Specimen Description BLOOD RIGHT ANTECUBITAL   Final   Special Requests BOTTLES DRAWN AEROBIC AND ANAEROBIC 5CCS   Final   Culture  Setup Time     Final   Value: 12/26/2013 18:20     Performed at [facility]   Culture     Final   Value: GRAM POSITIVE COCCI IN PAIRS AND CHAINS     Note: Gram Stain Report Called to,Read Back By and Verified With: CALWITHIN Z 12/27/13 1010 BY SMITHERSJ     Performed at [facility]   Report Status PENDING   Incomplete  CULTURE, BLOOD (ROUTINE X 2)     Status: None   Collection Time    12/26/13  2:08 PM      Result Value Range Status   Specimen Description BLOOD RIGHT FOREARM   Final   Special Requests BOTTLES DRAWN AEROBIC AND ANAEROBIC 5CC   Final   Culture  Setup Time     Final   Value: 12/26/2013 20:37     Performed at [facility]   Culture     Final   Value:        BLOOD CULTURE RECEIVED NO GROWTH TO DATE CULTURE WILL BE HELD FOR 5 DAYS BEFORE ISSUING A FINAL NEGATIVE REPORT     Performed at [facility]   Report Status PENDING   Incomplete     Labs: Basic Metabolic Panel:  Recent Labs Lab 12/24/13 1626 12/26/13 1250 12/27/13 0323  NA 129* 132* 132*  K 4.8 4.3 4.1  CL 94* 89* 96  CO2 26 25 25   GLUCOSE 113* 122* 142*  BUN 11 11 17   CREATININE 0.8 0.71 0.68  CALCIUM 7.9* 8.4 7.5*   Liver Function Tests:  Recent Labs Lab 12/24/13 1626 12/26/13 1250 12/27/13 0323  AST 45* 46* 51*  ALT 45 41 35  ALKPHOS 205* 230* 192*  BILITOT 3.2* 2.8* 2.4*  PROT 6.9 7.3 5.4*  ALBUMIN 2.1* 2.1* 1.5*   No results found for this basename: LIPASE, AMYLASE,  in the last 168 hours  Recent Labs Lab 12/26/13 1759  AMMONIA 15   CBC:  Recent Labs Lab 12/24/13 1626 12/26/13 1250  WBC 10.6 Repeated and verified X2.* 9.4  NEUTROABS 9.2* 7.2  HGB 12.5* 13.6  HCT 37.3* 39.2  MCV 92.9 91.2  PLT 346.0 375   Cardiac Enzymes: No results found for this basename: CKTOTAL, CKMB, CKMBINDEX, TROPONINI,  in the last 168 hours BNP: BNP (last 3 results) No results found for this basename: PROBNP,  in the last 8760 hours CBG: No results found for this basename: GLUCAP,  in the last 168 hours     Signed:  Clora Ohmer  A  [facility] 12/27/2013, 1:02 PM

## 2013-12-27 NOTE — H&P (Signed)
Patient being transferred to [place].   Signed,  Criselda Peaches, MD [facility]

## 2013-12-27 NOTE — Progress Notes (Signed)
INITIAL NUTRITION ASSESSMENT  DOCUMENTATION CODES Per approved criteria  -Severe malnutrition in the context of acute illness or injury   INTERVENTION: Encourage PO intake Provide Snacks TID  NUTRITION DIAGNOSIS: Inadequate oral intake related to nausea and poor appetite as evidenced by 7% weight loss in less than one month.   Goal: Pt to meet >/= 90% of their estimated nutrition needs    Monitor:  PO intake Weight Labs  Reason for Assessment: Malnutrition Screening Tool, score of 2  48 y.o. male  Admitting Dx: Liver abscess  ASSESSMENT: 48 y.o. male, with non-pertinent PMH presents to the ED with fever, chills, abdominal pain, confusion and malaise X 3 weeks. Returned to PCP on 12/24/13 with ongoing, progressive symptoms. LFTs were abnormal and was instructed to go to the ED for a CT scan which demonstrated 5 liver abscesses.  Pt states that since 12/05/13 he has had no appetite and has little interest in food. He states he has been eating less than one meal daily for the past 3 weeks. He states he usually weighs 175 to 179 lbs and has lost 13 lbs or more within the month. Pt is not interested in receiving any nutritional supplements. Encouraged pt to eat more; encouraged snacking throughout the day and encouraged energy/nutrient intake via beverages. Per pt's report he lost 7% of his body weight in the past 3 weeks and weight history shows 4% weight loss in the past 2 weeks.   Pt meets criteria for SEVERE MALNUTRITION in the context of ACUTE ILLNESS as evidenced by 7% weight loss in less than one month and estimated energy intake <50% of estimated energy needs for > 5 days.   Height: Ht Readings from Last 1 Encounters:  12/26/13 5\' 9"  (1.753 m)    Weight: Wt Readings from Last 1 Encounters:  12/26/13 163 lb (73.936 kg)    Ideal Body Weight: 160 lbs  % Ideal Body Weight: 102%  Wt Readings from Last 10 Encounters:  12/26/13 163 lb (73.936 kg)  12/24/13 164 lb (74.39  kg)  12/12/13 170 lb (77.111 kg)  12/09/13 170 lb 8 oz (77.338 kg)  07/22/13 176 lb 8 oz (80.06 kg)  06/10/13 174 lb 8 oz (79.153 kg)  08/13/12 174 lb (78.926 kg)  08/09/12 174 lb 4 oz (79.039 kg)    Usual Body Weight: 175 lbs  % Usual Body Weight: 93%  BMI:  Body mass index is 24.06 kg/(m^2).  Estimated Nutritional Needs: Kcal: 2000-2200 Protein: 80-90 grams Fluid: 2-2.2 L/day  Skin: intact; jaundice  Diet Order: General  EDUCATION NEEDS: -No education needs identified at this time   Intake/Output Summary (Last 24 hours) at 12/27/13 1200 Last data filed at 12/27/13 0612  Gross per 24 hour  Intake 1273.33 ml  Output    225 ml  Net 1048.33 ml    Last BM: 1/22  Labs:   Recent Labs Lab 12/24/13 1626 12/26/13 1250 12/27/13 0323  NA 129* 132* 132*  K 4.8 4.3 4.1  CL 94* 89* 96  CO2 26 25 25   BUN 11 11 17   CREATININE 0.8 0.71 0.68  CALCIUM 7.9* 8.4 7.5*  GLUCOSE 113* 122* 142*    CBG (last 3)  No results found for this basename: GLUCAP,  in the last 72 hours  Scheduled Meds: . heparin  5,000 Units Subcutaneous Q8H    Continuous Infusions: . sodium chloride 150 mL/hr at 12/27/13 4782    Past Medical History  Diagnosis Date  . Chicken pox   .  Allergy   . Insomnia 08/09/2012  . Back pain 08/09/2012  . Allergic rhinitis due to pollen 08/09/2012  . Herniated disc   . Abscess of liver 12/2013    Past Surgical History  Procedure Laterality Date  . Tonsillectomy  1974  . Wisdom tooth extraction  1985  . Tonsillectomy      Pryor Hunter Owens RD, LDN Inpatient Clinical Dietitian Pager: [phone] After Hours Pager: [phone]

## 2013-12-27 NOTE — Progress Notes (Signed)
Subjective: BP low, asymptomatic.  Manual repeat 98/54.  No dysuria.  Still having chills and sweats.  No abdominal pain.    Objective: Vital signs in last 24 hours: Temp:  [97.4 F (36.3 C)-103.5 F (39.7 C)] 97.4 F (36.3 C) (01/23 0447) Pulse Rate:  [60-105] 73 (01/23 0447) Resp:  [16-24] 22 (01/23 0447) BP: (86-116)/(50-76) 94/52 mmHg (01/23 0505) SpO2:  [93 %-100 %] 97 % (01/23 0447) Weight:  [163 lb (73.936 kg)] 163 lb (73.936 kg) (01/22 1540) Last BM Date: 12/26/13  Intake/Output from previous day: 01/22 0701 - 01/23 0700 In: 1273.3 [I.V.:1273.3] Out: 225 [Urine:225] Intake/Output this shift:   Physical Exam  Constitutional: He is oriented to person, place, and time. He appears well-developed and well-nourished. No distress.   Cardiovascular: Normal rate, regular rhythm, normal heart sounds and intact distal pulses. Exam reveals no gallop and no friction rub.  No murmur heard.  Respiratory: Effort normal. No respiratory distress. He has no wheezes. He has no rales. He exhibits no tenderness.  GI: Soft. Bowel sounds are normal. He exhibits no distension and no mass. There is mild tenderness to RUQ. There is no rebound and no guarding.  Neurological: He is alert and oriented to person, place, and time.  Skin: Skin is warm and dry. No rash noted. He is not diaphoretic. No pallor.    Lab Results:   Recent Labs  12/24/13 1626 12/26/13 1250  WBC 10.6 Repeated and verified X2.* 9.4  HGB 12.5* 13.6  HCT 37.3* 39.2  PLT 346.0 375   BMET  Recent Labs  12/26/13 1250 12/27/13 0323  NA 132* 132*  K 4.3 4.1  CL 89* 96  CO2 25 25  GLUCOSE 122* 142*  BUN 11 17  CREATININE 0.71 0.68  CALCIUM 8.4 7.5*   PT/INR  Recent Labs  12/26/13 1250 12/27/13 0323  LABPROT 13.1 16.1*  INR 1.01 1.32   ABG No results found for this basename: PHART, PCO2, PO2, HCO3,  in the last 72 hours  Studies/Results: Dg Chest 2 View  12/26/2013   CLINICAL DATA:  Weakness  EXAM:  CHEST  2 VIEW  COMPARISON:  None.  FINDINGS: There is mild scarring in the lateral right base. Lungs are otherwise clear. Heart size and pulmonary vascularity are normal. No adenopathy. No bone lesions.  IMPRESSION: No edema or consolidation.   Electronically Signed   By: Lowella Grip M.D.   On: 12/26/2013 13:04   Ct Abdomen Pelvis W Contrast  12/26/2013   ADDENDUM REPORT: 12/26/2013 10:13  ADDENDUM: These results were called by telephone at the time of interpretation on 12/26/2013 at 10:10 AM to Dr. [name] , who verbally acknowledged these results.   Electronically Signed   By: Julian Hy M.D.   On: 12/26/2013 10:13   12/26/2013   CLINICAL DATA:  Weight loss, elevated LFTs, jaundice  EXAM: CT ABDOMEN AND PELVIS WITH CONTRAST  TECHNIQUE: Multidetector CT imaging of the abdomen and pelvis was performed using the standard protocol following bolus administration of intravenous contrast.  CONTRAST:  144mL OMNIPAQUE IOHEXOL 300 MG/ML  SOLN  COMPARISON:  None.  FINDINGS: Trace bilateral pleural effusions.  Minimal dependent atelectasis.  Cardiomegaly.  Small pericardial effusion.  Multiple complex/septated low-density lesions in both lobes of the liver, including:  --5.5 x 6.4 cm lesion in the anterior right hepatic dome (series 2/image 15)  --10.8 x 13.2 cm lesion predominantly in the lateral segment left hepatic lobe (series 2/image 23)  --7.1 x 8.6 cm  lesion in the posterior segment right hepatic lobe (series 2/image 30)  --5.1 x 6.0 cm lesion posteriorly in the lateral segment left hepatic lobe (series 2/image 33)  --6.0 x 5.4 cm lesion inferiorly in the posterior segment right hepatic lobe (series 2/image 38)  All lesions measure just higher than simple fluid density and have a surrounding thickened wall/rim, favored to reflect hepatic abscesses. Necrotic masses (metastases) are considered less likely. Associated mild periportal edema, predominantly in the left hepatic lobe (series 2/ image  32). Associated filling defect within a branch of the right hepatic vein (series 2/image 20).  Spleen is at the upper limits of normal for size, measuring 13.6 cm in craniocaudal dimension.  Pancreas and adrenal glands are within normal limits.  Contracted gallbladder with numerous gallstones (series 2/ image 74). No associated inflammatory changes. No intrahepatic or extrahepatic ductal dilatation.  Kidneys are within normal limits.  No hydronephrosis.  No evidence of bowel obstruction. Normal appendix. Sigmoid diverticulosis, without convincing associated inflammatory changes.  No evidence of abdominal aortic aneurysm.  Trace pelvic ascites.  No suspicious abdominopelvic lymphadenopathy.  Prostate is unremarkable.  Bladder is underdistended.  Degenerative changes of the visualized thoracolumbar spine.  IMPRESSION: Multiple complex fluid density hepatic lesions, measuring up to 13.2 cm, as described above. This appearance favors hepatic abscesses, with necrotic masses (metastases) considered less likely.  Associated mild periportal edema and thrombosis within a branch of the right hepatic vein.  Associated small pericardial effusion and trace bilateral pleural effusions.  Electronically Signed: By: Julian Hy M.D. On: 12/26/2013 10:04    Anti-infectives: Anti-infectives   None      Assessment/Plan: Multiple complex fluid collections/lesions measuring up to 13.2cm  Malaise, fatigue, jaundice  Hypotension Increase IVF rate, ID consult, possible needle aspiration.  No surgery indicated at this time.    LOS: 1 day    Erby Pian ANP-BC Pager [phone] 12/27/2013 8:32 AM

## 2013-12-29 LAB — CULTURE, BLOOD (ROUTINE X 2)

## 2013-12-31 LAB — ECHINOCOCCUS ANTIBODY, IGG

## 2014-01-01 LAB — E. HISTOLYTICA ANTIBODY (AMOEBA AB)

## 2014-01-08 ENCOUNTER — Ambulatory Visit (INDEPENDENT_AMBULATORY_CARE_PROVIDER_SITE_OTHER): Payer: Managed Care, Other (non HMO) | Admitting: Family Medicine

## 2014-01-08 ENCOUNTER — Encounter: Payer: Self-pay | Admitting: Family Medicine

## 2014-01-08 VITALS — BP 118/74 | HR 90 | Temp 97.9°F | Wt 168.5 lb

## 2014-01-08 DIAGNOSIS — Z1211 Encounter for screening for malignant neoplasm of colon: Secondary | ICD-10-CM

## 2014-01-08 DIAGNOSIS — E43 Unspecified severe protein-calorie malnutrition: Secondary | ICD-10-CM

## 2014-01-08 DIAGNOSIS — F05 Delirium due to known physiological condition: Secondary | ICD-10-CM

## 2014-01-08 DIAGNOSIS — R7881 Bacteremia: Secondary | ICD-10-CM

## 2014-01-08 DIAGNOSIS — K750 Abscess of liver: Secondary | ICD-10-CM

## 2014-01-08 LAB — COMPREHENSIVE METABOLIC PANEL
ALT: 18 U/L (ref 0–53)
AST: 14 U/L (ref 0–37)
Albumin: 2.1 g/dL — ABNORMAL LOW (ref 3.5–5.2)
Alkaline Phosphatase: 179 U/L — ABNORMAL HIGH (ref 39–117)
BUN: 6 mg/dL (ref 6–23)
CALCIUM: 8.3 mg/dL — AB (ref 8.4–10.5)
CHLORIDE: 101 meq/L (ref 96–112)
CO2: 29 mEq/L (ref 19–32)
CREATININE: 0.6 mg/dL (ref 0.4–1.5)
GFR: 161.77 mL/min (ref >60.00)
GLUCOSE: 99 mg/dL (ref 70–99)
POTASSIUM: 4.2 meq/L (ref 3.5–5.1)
Sodium: 137 mEq/L (ref 135–145)
Total Bilirubin: 1.6 mg/dL — ABNORMAL HIGH (ref 0.3–1.2)
Total Protein: 6.2 g/dL (ref 6.0–8.3)

## 2014-01-08 LAB — CBC WITH DIFFERENTIAL/PLATELET
BASOS PCT: 0.5 % (ref 0.0–3.0)
Basophils Absolute: 0.1 10*3/uL (ref 0.0–0.1)
EOS ABS: 0 10*3/uL (ref 0.0–0.7)
EOS PCT: 0.3 % (ref 0.0–5.0)
HCT: 34.3 % — ABNORMAL LOW (ref 39.0–52.0)
Hemoglobin: 11.3 g/dL — ABNORMAL LOW (ref 13.0–17.0)
LYMPHS PCT: 11.1 % — AB (ref 12.0–46.0)
Lymphs Abs: 1.3 10*3/uL (ref 0.7–4.0)
MCHC: 32.8 g/dL (ref 30.0–36.0)
MCV: 92.9 fl (ref 78.0–100.0)
Monocytes Absolute: 0.7 10*3/uL (ref 0.1–1.0)
Monocytes Relative: 6 % (ref 3.0–12.0)
Neutro Abs: 9.3 10*3/uL — ABNORMAL HIGH (ref 1.4–7.7)
Neutrophils Relative %: 82.1 % — ABNORMAL HIGH (ref 43.0–77.0)
Platelets: 437 10*3/uL — ABNORMAL HIGH (ref 150.0–400.0)
RBC: 3.7 Mil/uL — AB (ref 4.22–5.81)
RDW: 14.6 % (ref 11.5–14.6)
WBC: 11.3 10*3/uL — AB (ref 4.5–10.5)

## 2014-01-08 MED ORDER — OXYCODONE HCL 5 MG PO TABS: 5.0000 mg | ORAL_TABLET | Freq: Three times a day (TID) | ORAL | Status: DC | PRN

## 2014-01-08 NOTE — Progress Notes (Signed)
Subjective:    Patient ID: Hunter Owens, male    DOB: 11-Feb-1966, 48 y.o.   MRN: [id number]  HPI  48 year old male pt with only hx of chronic back pain of Dr. Lillie Fragmin presents following recent complicated hospital course. Wife first became concerned after noting confusion, erratic behavoir beginning on 1/4. Confusion improved, but over following  3 weeks he had night sweats, > 10 lb weight loss  And pressure in RUQ.  He presented to [facility] after appt with PCP 1/21 when LFTs were found to be elevated.  He was transferred to [facility] after he was diagnosed with   1. Severe sepsis 2/2 strep intermedius pyogenic liver abscess.   CT on 1/23 showed: several large multiloculated cystic lesions in liver, trace ascites.  This was felt to be due possible to previous GI illness 10/2013.  Two drains were placed on 1/24 but given no resolution of abscesses, one was removed and two more placed on 1/29.  He was treated with Ceftriaxone 2 g daily  With end date planned 02/13/2014.  He also needs referral to local surgeon for drain management.  ID also recommended colonoscopy referral to rule out colon cancer as can be seen with [facility].  2. S intermedius bacteremia: initial b cx positive, following have be negative. TEE was negative.  3.toxic metabolic encephalopathy: Resolved with fluids and antibitoics.  4. Hypokalemia: needs repeat BMET but was resolved at D/C after fluids.   Today pt reports:  Pain is controlled with oxycodone 2-3 times  A day , ibuprofen every 8 hours.  No fever, no confusion. No rigors. Wife is giving him IV antibiotics daily.  [facility] is manageing PICC line.    Wife has been following output of drains. Two posterior drains are not draining more than 10-20 cc, about 10 cc in last 24 hours. The anterior drain is draining bile, > 180 cc daily.  Pt and wife want posterior drains removed if they can..if CT appears improve. Posterior drains make it difficult for him to sleep,  cannot get comfortable.   2/16 CT and re-eval scheduled by ID at [facility], NP.  Dr. Caro Hight saw him in hospital.  Review of Systems  Constitutional: Positive for fatigue. Negative for fever.       He is deconditioned  HENT: Negative for ear pain.   Eyes: Negative for pain.  Respiratory: Negative for cough, shortness of breath and wheezing.   Cardiovascular: Negative for chest pain, palpitations and leg swelling.  Gastrointestinal: Negative for abdominal pain.       Objective:   Physical Exam  Constitutional:  Thin appearing male in NAD  HENT:  Head: Normocephalic.  Right Ear: External ear normal.  Left Ear: External ear normal.  Mouth/Throat: Oropharynx is clear and moist.  Eyes: Conjunctivae are normal. Pupils are equal, round, and reactive to light. Right eye exhibits no discharge. Left eye exhibits no discharge.  Neck: Normal range of motion. Neck supple. No thyromegaly present.  Cardiovascular: Normal rate, normal heart sounds and intact distal pulses.  Exam reveals no friction rub.   No murmur heard. Pulmonary/Chest: Effort normal and breath sounds normal. No respiratory distress. He has no wheezes. He has no rales. He exhibits no tenderness.  Abdominal: There is hepatomegaly. There is tenderness in the right upper quadrant, epigastric area and periumbilical area.    Neurological: He has normal strength and normal reflexes. No cranial nerve deficit or sensory deficit. He displays a negative Romberg sign.  GCS eye subscore is 4. GCS verbal subscore is 5. GCS motor subscore is 6.  Skin:  Several areas of bruising from heparin injections Healing incision at previous location of drain on mid back on right.  Psychiatric: He has a normal mood and affect. His speech is normal and behavior is normal. Judgment and thought content normal. His mood appears not anxious. Cognition and memory are normal.          Assessment & Plan:  Total visit time 50 minutes, > 50%  spent counseling and cordinating patients care.  Sent today for CMET and cbc diff. ( Labs returned with slight increase in wbc from discharge, but gradual trend down of LFTs.) Continue ceftriaxone daily until 02/13/2014 or 1 week after last drain pulled.  Discussed case with ID at [facility]. Dr. Vickki Muff after appt. Notified pt of  discussion.   Dr. Vickki Muff doubts that drains are ready to be removed at this time.  Stated  CT abd pelvis could be repeated and compared early if pt  felt strongly but  Mr. Oglesby states he is okay with waiting if that is  what Dr. Vickki Muff recommends.   Referral to local surgeon for drain management/further will be considered  after re-eval of abscess size on 2/16 with CT at ID follow up.  Discussed possibility of [place] lab weekly.. This will be set up  Weekly CMET, cbc diff:  fax results to Tillman Sers NP at [phone]  Will refer to GI for colonoscopy in 3-4 months for colon cancer eval.   Follow up with Dr. Lorelei Pont PCP sometime after ID follow up or sooner if issues.  Pain medication refilled, with expectations of continued use over next month.

## 2014-01-08 NOTE — Patient Instructions (Signed)
Stop at lab on way put.  I will call you once I contact [facility] about the drains.  Call if any fever, increasing pain, confusion.

## 2014-01-08 NOTE — Progress Notes (Signed)
Called Hunter Owens and verbal orders given to do weekly blood draws from [facility] for CMET & CBC w/Diff starting next week.  Requested results be faxed to our office as well as to NP at [facility] at [phone].

## 2014-01-08 NOTE — Progress Notes (Signed)
Pre-visit discussion using our clinic review tool. No additional management support is needed unless otherwise documented below in the visit note.  

## 2014-02-13 ENCOUNTER — Encounter: Payer: Self-pay | Admitting: Family Medicine

## 2014-02-17 DIAGNOSIS — Z452 Encounter for adjustment and management of vascular access device: Secondary | ICD-10-CM

## 2014-02-17 DIAGNOSIS — Z5181 Encounter for therapeutic drug level monitoring: Secondary | ICD-10-CM

## 2014-02-17 DIAGNOSIS — K750 Abscess of liver: Secondary | ICD-10-CM

## 2014-03-03 ENCOUNTER — Encounter: Payer: Self-pay | Admitting: Family Medicine

## 2014-03-03 ENCOUNTER — Ambulatory Visit (INDEPENDENT_AMBULATORY_CARE_PROVIDER_SITE_OTHER): Payer: Managed Care, Other (non HMO) | Admitting: Family Medicine

## 2014-03-03 VITALS — BP 102/72 | HR 64 | Temp 97.8°F | Ht 68.0 in | Wt 163.8 lb

## 2014-03-03 DIAGNOSIS — D649 Anemia, unspecified: Secondary | ICD-10-CM

## 2014-03-03 DIAGNOSIS — R7881 Bacteremia: Secondary | ICD-10-CM

## 2014-03-03 DIAGNOSIS — E43 Unspecified severe protein-calorie malnutrition: Secondary | ICD-10-CM

## 2014-03-03 DIAGNOSIS — K750 Abscess of liver: Secondary | ICD-10-CM

## 2014-03-03 NOTE — Progress Notes (Signed)
Date:  03/03/2014   Name:  Hunter Owens   DOB:  09-Jun-1966   MRN:  [id number]  Primary Physician:  [name], MD   Chief Complaint: Follow-up   Subjective:   History of Present Illness:  Hunter Owens is a 48 y.o. pleasant patient who presents with the following:  F/u hosp / liver abscess, post IV abx, post-drains:  Getting winded some. Got PICC line out on the 12th.  Feeling very good now. No significant limitations.  Not noticing it at all.   He had a pleural effusion on his last CT abd/pelvis, SOB and pleurisy has gradually been getting better.  5/20 - CT and then f/u on [facility] the next day.   Weight in (lb) to have BMI = 25: 164.1  No significant pain, travelling and back to work.  Patient Active Problem List   Diagnosis Date Noted  . Bacteremia due to Gram-positive bacteria 12/27/2013  . Protein-calorie malnutrition, severe 12/27/2013  . Liver abscess, 12/2013, resolved s/p drainage, IV ABX 12/26/2013  . Insomnia 08/09/2012  . Back pain 08/09/2012  . Allergic rhinitis due to pollen 08/09/2012    Past Medical History  Diagnosis Date  . Chicken pox   . Allergy   . Insomnia 08/09/2012  . Back pain 08/09/2012  . Allergic rhinitis due to pollen 08/09/2012  . Herniated disc   . Abscess of liver 12/2013    Past Surgical History  Procedure Laterality Date  . Tonsillectomy  1974  . Wisdom tooth extraction  1985  . Tonsillectomy      History   Social History  . Marital Status: Married    Spouse Name: N/A    Number of Children: N/A  . Years of Education: N/A   Occupational History  . Not on file.   Social History Main Topics  . Smoking status: Former Smoker    Types: Cigarettes  . Smokeless tobacco: Never Used  . Alcohol Use: Yes     Comment: social- none right now  . Drug Use: No  . Sexual Activity: Not on file   Other Topics Concern  . Not on file   Social History Narrative  . No narrative on file    Family History  Problem Relation Age of  Onset  . Heart disease Mother     No Known Allergies  Medication list has been reviewed and updated.  Review of Systems:   GEN: some weight loss GI: No n/v/d, eating normally Pulm: as above Interactive and getting along well at home.  Otherwise, ROS is as per the HPI.  Objective:   Physical Examination: BP 102/72  Pulse 64  Temp(Src) 97.8 F (36.6 C) (Oral)  Ht 5\' 8"  (1.727 m)  Wt 163 lb 12 oz (74.277 kg)  BMI 24.90 kg/m2  Ideal Body Weight: Weight in (lb) to have BMI = 25: 164.1   GEN: WDWN, NAD, Non-toxic, A & O x 3 HEENT: Atraumatic, Normocephalic. Neck supple. No masses, No LAD. Ears and Nose: No external deformity. CV: RRR, No M/G/R. No JVD. No thrill. No extra heart sounds. PULM: CTA B, no wheezes, crackles, rhonchi. No retractions. No resp. distress. No accessory muscle use. EXTR: No c/c/e NEURO Normal gait.  PSYCH: Normally interactive. Conversant. Not depressed or anxious appearing.  Calm demeanor.   Laboratory and Imaging Data: [facility] data reviewed  Assessment & Plan:   Liver abscess, 12/2013, resolved s/p drainage, IV ABX  Bacteremia due to Gram-positive bacteria  Protein-calorie malnutrition, severe  Anemia  Doing much better now, f/u with ID in may, f/u with me annually as long as feeling ok  No Follow-up on file.  No orders of the defined types were placed in this encounter.   Patient's Medications  New Prescriptions   No medications on file  Previous Medications   FERROUS SULFATE 325 (65 FE) MG TABLET    Take 325 mg by mouth daily.  Modified Medications   No medications on file  Discontinued Medications   ACETAMINOPHEN (TYLENOL) 325 MG TABLET    Take 650 mg by mouth every 4 (four) hours as needed for mild pain.   IBUPROFEN (ADVIL,MOTRIN) 800 MG TABLET    Take 1 tablet by mouth every 8 (eight) hours.   OXYCODONE (OXY IR/ROXICODONE) 5 MG IMMEDIATE RELEASE TABLET    Take 1 tablet (5 mg total) by mouth 3 (three) times daily as needed.    SENNA (SENOKOT) 8.6 MG TABLET    Take 2 tablets by mouth at bedtime.   There are no Patient Instructions on file for this visit.  Signed,  Maud Deed. Jamonica Schoff, MD, [place] at [facility] [place] [address] Phone: [phone] Fax: [phone]

## 2014-03-04 ENCOUNTER — Encounter: Payer: Self-pay | Admitting: Family Medicine

## 2014-06-30 ENCOUNTER — Ambulatory Visit (INDEPENDENT_AMBULATORY_CARE_PROVIDER_SITE_OTHER): Payer: Managed Care, Other (non HMO) | Admitting: Family Medicine

## 2014-06-30 ENCOUNTER — Encounter: Payer: Self-pay | Admitting: Family Medicine

## 2014-06-30 VITALS — BP 102/62 | HR 63 | Temp 98.2°F | Ht 68.0 in | Wt 173.2 lb

## 2014-06-30 DIAGNOSIS — M6790 Unspecified disorder of synovium and tendon, unspecified site: Secondary | ICD-10-CM

## 2014-06-30 DIAGNOSIS — M67951 Unspecified disorder of synovium and tendon, right thigh: Secondary | ICD-10-CM

## 2014-06-30 DIAGNOSIS — M6798 Unspecified disorder of synovium and tendon, other site: Secondary | ICD-10-CM

## 2014-06-30 DIAGNOSIS — M719 Bursopathy, unspecified: Secondary | ICD-10-CM

## 2014-06-30 NOTE — Progress Notes (Signed)
Pre visit review using our clinic review tool, if applicable. No additional management support is needed unless otherwise documented below in the visit note. 
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
## 2014-09-19 ENCOUNTER — Other Ambulatory Visit: Payer: Self-pay

## 2014-10-15 ENCOUNTER — Encounter: Payer: Self-pay | Admitting: Gastroenterology

## 2014-10-15 ENCOUNTER — Ambulatory Visit (INDEPENDENT_AMBULATORY_CARE_PROVIDER_SITE_OTHER): Payer: Managed Care, Other (non HMO) | Admitting: Family Medicine

## 2014-10-15 ENCOUNTER — Encounter: Payer: Self-pay | Admitting: Family Medicine

## 2014-10-15 VITALS — BP 100/70 | HR 83 | Temp 98.1°F | Ht 68.0 in | Wt 171.2 lb

## 2014-10-15 DIAGNOSIS — A499 Bacterial infection, unspecified: Secondary | ICD-10-CM

## 2014-10-15 DIAGNOSIS — R7881 Bacteremia: Secondary | ICD-10-CM

## 2014-10-15 DIAGNOSIS — K750 Abscess of liver: Secondary | ICD-10-CM

## 2014-10-15 DIAGNOSIS — Z1211 Encounter for screening for malignant neoplasm of colon: Secondary | ICD-10-CM

## 2014-10-15 MED ORDER — ZOLPIDEM TARTRATE 10 MG PO TABS: 10.0000 mg | ORAL_TABLET | ORAL | Status: DC | PRN

## 2014-10-15 MED ORDER — CYCLOBENZAPRINE HCL 5 MG PO TABS: 5.0000 mg | ORAL_TABLET | ORAL | Status: AC | PRN

## 2014-10-15 NOTE — Progress Notes (Signed)
Dr. Frederico Hamman T. Oluchi Pucci, MD, [facility] Primary [facility] and Sports Medicine [place], [address] Phone: [phone] Fax: [phone]  10/15/2014  Patient: Hunter Owens, MRN: [id number], DOB: November 25, 1966, 48 y.o.  Primary Physician:  [name], MD  Chief Complaint: Referral  Subjective:   Hunter Owens is a 48 y.o. very pleasant male patient who presents with the following:  ID also recommended colonoscopy referral to rule out colon cancer as can be seen with S bovis.  This was recommended when he was discharged from [facility], recommendation from [facility] ID.  He has been feeling quite well, so he did not come in to discuss this further until now. He is currently age 69.  He also needs refills on his Flexeril as well as his Ambien. Does have intermittent insomnia, mostly when he is traveling.  06/30/2014 Last OV with [name], MD  F/u liver abscess and complex x hosp with bacteremia.   He presented to [facility] after appt with PCP 1/21 when LFTs were found to be elevated.  He was transferred to [facility] after he was diagnosed with   1. Severe sepsis 2/2 strep intermedius pyogenic liver abscess.   CT on 1/23 showed: several large multiloculated cystic lesions in liver, trace ascites.  This was felt to be due possible to previous GI illness 10/2013.  Two drains were placed on 1/24 but given no resolution of abscesses, one was removed and two more placed on 1/29.  He was treated with Ceftriaxone 2 g daily  With end date planned 02/13/2014.  He also needs referral to local surgeon for drain management.  ID also recommended colonoscopy referral to rule out colon cancer as can be seen with S bovis.  2. S intermedius bacteremia: initial b cx positive, following have be negative. TEE was negative.  3.toxic metabolic encephalopathy: Resolved with fluids and antibitoics.  4. Hypokalemia: needs repeat BMET but was resolved at D/C after fluids.   Today pt  reports:  Pain is controlled with oxycodone 2-3 times  A day , ibuprofen every 8 hours.  No fever, no confusion. No rigors. Wife is giving him IV antibiotics daily.  [facility] is manageing PICC line.    Wife has been following output of drains. Two posterior drains are not draining more than 10-20 cc, about 10 cc in last 24 hours. The anterior drain is draining bile, > 180 cc daily.  Pt and wife want posterior drains removed if they can..if CT appears improve. Posterior drains make it difficult for him to sleep, cannot get comfortable.   2/16 CT and re-eval scheduled by ID at [facility], NP.  Dr. Caro Hight saw him in hospital.   Past Medical History, Surgical History, Social History, Family History, Problem List, Medications, and Allergies have been reviewed and updated if relevant.  Patient Active Problem List   Diagnosis Date Noted  . Bacteremia due to Gram-positive bacteria 12/27/2013  . Liver abscess, 12/2013, resolved s/p drainage, IV ABX 12/26/2013  . Insomnia 08/09/2012  . Back pain 08/09/2012  . Allergic rhinitis due to pollen 08/09/2012    Past Medical History  Diagnosis Date  . Insomnia 08/09/2012  . Back pain 08/09/2012  . Allergic rhinitis due to pollen 08/09/2012  . Herniated disc   . Abscess of liver 12/2013    s/p drains placed at [place], IV ABX. Bacteremia.    Past Surgical History  Procedure Laterality Date  . Tonsillectomy  1974  . Wisdom tooth extraction  1985  . Tonsillectomy      History   Social History  . Marital Status: Married    Spouse Name: N/A    Number of Children: N/A  . Years of Education: N/A   Occupational History  . Not on file.   Social History Main Topics  . Smoking status: Current Some Day Smoker    Types: Cigarettes  . Smokeless tobacco: Never Used  . Alcohol Use: 0.0 oz/week    0 Not specified per week     Comment: social  . Drug Use: No  . Sexual Activity: Not on file   Other Topics Concern  . Not on file   Social  History Narrative    Family History  Problem Relation Age of Onset  . Heart disease Mother     No Known Allergies  Medication list reviewed and updated in full in [place].   GEN: No acute illnesses, no fevers, chills. GI: No n/v/d, eating normally Pulm: No SOB Interactive and getting along well at home.  Otherwise, ROS is as per the HPI.  Objective:   BP 100/70 mmHg  Pulse 83  Temp(Src) 98.1 F (36.7 C) (Oral)  Ht 5\' 8"  (1.727 m)  Wt 171 lb 4 oz (77.678 kg)  BMI 26.04 kg/m2  GEN: WDWN, NAD, Non-toxic, A & O x 3 HEENT: Atraumatic, Normocephalic. Neck supple. No masses, No LAD. Ears and Nose: No external deformity. CV: RRR, No M/G/R. No JVD. No thrill. No extra heart sounds. PULM: CTA B, no wheezes, crackles, rhonchi. No retractions. No resp. distress. No accessory muscle use. EXTR: No c/c/e NEURO Normal gait.  PSYCH: Normally interactive. Conversant. Not depressed or anxious appearing.  Calm demeanor.   Laboratory and Imaging Data:  Assessment and Plan:   Liver abscess, 12/2013, resolved s/p drainage, IV ABX - Plan: Ambulatory referral to Gastroenterology  Bacteremia due to Gram-positive bacteria - Plan: Ambulatory referral to Gastroenterology  Special screening for malignant neoplasms, colon - Plan: Ambulatory referral to Gastroenterology  Consult GI for colonoscopy given history of strep bovis. This is at the recommendation of [facility] infectious disease.  Refill the patient's Ambien and Flexeril.  Otherwise he is doing fine.  Follow-up: No Follow-up on file.  New Prescriptions   No medications on file   Orders Placed This Encounter  Procedures  . Ambulatory referral to Gastroenterology    Signed,  Frederico Hamman T. Stephfon Bovey, MD   Patient's Medications  New Prescriptions   No medications on file  Previous Medications   No medications on file  Modified Medications   Modified Medication Previous Medication   CYCLOBENZAPRINE (FLEXERIL) 5 MG TABLET  cyclobenzaprine (FLEXERIL) 5 MG tablet      Take 1 tablet (5 mg total) by mouth as needed for muscle spasms.    Take 5 mg by mouth as needed for muscle spasms.   ZOLPIDEM (AMBIEN) 10 MG TABLET zolpidem (AMBIEN) 10 MG tablet      Take 1 tablet (10 mg total) by mouth as needed for sleep.    Take 10 mg by mouth as needed for sleep.  Discontinued Medications   No medications on file

## 2014-10-15 NOTE — Patient Instructions (Signed)

## 2014-10-15 NOTE — Progress Notes (Signed)
Pre visit review using our clinic review tool, if applicable. No additional management support is needed unless otherwise documented below in the visit note. 

## 2014-11-14 ENCOUNTER — Ambulatory Visit (AMBULATORY_SURGERY_CENTER): Payer: Self-pay

## 2014-11-14 ENCOUNTER — Telehealth: Payer: Self-pay

## 2014-11-14 VITALS — Ht 69.0 in | Wt 174.4 lb

## 2014-11-14 DIAGNOSIS — A491 Streptococcal infection, unspecified site: Secondary | ICD-10-CM

## 2014-11-14 DIAGNOSIS — Z1211 Encounter for screening for malignant neoplasm of colon: Secondary | ICD-10-CM

## 2014-11-14 MED ORDER — MOVIPREP 100 G PO SOLR: ORAL | Status: DC

## 2014-11-14 NOTE — Telephone Encounter (Signed)
Dr Stacie Glaze pt is scheduled for his pre-visit today prior to his colonoscopy with you on 12/03/14.I was reviewing his past medical history which shows   a complicated medical history of a liver abscess, bacteremia and staph bovis .He was seen at [facility].Does he need an office visit first or is a direct colon OK. Please advise. Thanks

## 2014-11-14 NOTE — Telephone Encounter (Signed)
Called pt and left message he was OK for a direct colon per Dr Fuller Plan.

## 2014-11-14 NOTE — Telephone Encounter (Signed)
OK for direct colonoscopy Liver abscesses resolved several months ago

## 2014-11-14 NOTE — Progress Notes (Signed)
Per pt, no allergies to soy or egg products.Pt not taking any weight loss meds or using  O2 at home. 

## 2014-12-03 ENCOUNTER — Ambulatory Visit (AMBULATORY_SURGERY_CENTER): Payer: Managed Care, Other (non HMO) | Admitting: Gastroenterology

## 2014-12-03 ENCOUNTER — Encounter: Payer: Self-pay | Admitting: Gastroenterology

## 2014-12-03 VITALS — BP 148/85 | HR 66 | Temp 97.1°F | Resp 52 | Ht 69.0 in | Wt 174.0 lb

## 2014-12-03 DIAGNOSIS — D125 Benign neoplasm of sigmoid colon: Secondary | ICD-10-CM

## 2014-12-03 DIAGNOSIS — Z1211 Encounter for screening for malignant neoplasm of colon: Secondary | ICD-10-CM

## 2014-12-03 DIAGNOSIS — K635 Polyp of colon: Secondary | ICD-10-CM

## 2014-12-03 MED ORDER — SODIUM CHLORIDE 0.9 % IV SOLN: 500.0000 mL | INTRAVENOUS | Status: DC

## 2014-12-03 NOTE — Op Note (Signed)
[facility]  [address]. [place], [address]   COLONOSCOPY PROCEDURE REPORT  PATIENT: Hunter Owens, Hunter Owens  MR#: [id number] BIRTHDATE: Feb 12, 1966 , 48  yrs. old GENDER: male ENDOSCOPIST: Ladene Artist, MD, [facility] REFERRED [id number], Maud Deed. PROCEDURE DATE:  12/03/2014 PROCEDURE:   Colonoscopy with biopsy First Screening Colonoscopy - Avg.  risk and is 50 yrs.  old or older Yes.  Prior Negative Screening - Now for repeat screening. N/A  History of Adenoma - Now for follow-up colonoscopy & has been > or = to 3 yrs.  N/A  Polyps Removed Today? Yes. ASA CLASS:   Class II INDICATIONS:average risk for colorectal cancer. MEDICATIONS: Monitored anesthesia care and Propofol 200 mg IV DESCRIPTION OF PROCEDURE:   After the risks benefits and alternatives of the procedure were thoroughly explained, informed consent was obtained.  The digital rectal exam revealed no abnormalities of the rectum.   The LB [id number] and LB [id number]  endoscope was introduced through the anus and advanced to the cecum, which was identified by both the appendix and ileocecal valve. No adverse events experienced.   The quality of the prep was good, using MoviPrep  The instrument was then slowly withdrawn as the colon was fully examined.  COLON FINDINGS: There was moderate diverticulosis noted in the descending colon and sigmoid colon. There was mild diverticulosis noted in the transverse colon.   A sessile polyp measuring 5 mm in size was found in the sigmoid colon.  A polypectomy was performed with cold forceps.  The resection was complete, the polyp tissue was completely retrieved and sent to histology.   The examination was otherwise normal.  Retroflexed views revealed internal Grade I hemorrhoids. The time to cecum=1 minutes 36 seconds.  Withdrawal time=11 minutes 39 seconds.  The scope was withdrawn and the procedure completed. COMPLICATIONS: There were no immediate  complications.  ENDOSCOPIC IMPRESSION: 1.   Moderate diverticulosis in the descending colon and sigmoid colon 2.   Mild diverticulosis in the transverse colon 3.   Sessile polyp in the sigmoid colon; polypectomy performed with cold forceps 4.   Grade l internal hemorrhoids  RECOMMENDATIONS: 1.  Await pathology results 2.  Repeat colonoscopy in 5 years if polyp adenomatous; otherwise 10 years 3.  High fiber diet with liberal fluid intake.  eSigned:  Ladene Artist, MD, [facility] 12/03/2014 11:46 AM

## 2014-12-03 NOTE — Progress Notes (Signed)
Called to room to assist during endoscopic procedure.  Patient ID and intended procedure confirmed with present staff. Received instructions for my participation in the procedure from the performing physician.  

## 2014-12-03 NOTE — Patient Instructions (Signed)

## 2014-12-03 NOTE — Progress Notes (Signed)
Report to PACU, RN, vss, BBS= Clear.  

## 2014-12-08 ENCOUNTER — Telehealth: Payer: Self-pay | Admitting: *Deleted

## 2014-12-08 NOTE — Telephone Encounter (Signed)
No answer, message left for the patient. 

## 2014-12-10 ENCOUNTER — Encounter: Payer: Self-pay | Admitting: Gastroenterology

## 2015-09-07 ENCOUNTER — Other Ambulatory Visit: Payer: Self-pay | Admitting: Family Medicine

## 2015-09-07 NOTE — Telephone Encounter (Signed)
Last office visit 10/15/2014.  Last refilled 10/15/2014 for #30 with 5 refills.  Ok to refill?

## 2015-09-07 NOTE — Telephone Encounter (Signed)
Ok to ref 30, 3 ref 

## 2015-09-07 NOTE — Telephone Encounter (Signed)
Zolpidem called into [facility] [address].

## 2015-10-26 ENCOUNTER — Encounter: Payer: Self-pay | Admitting: Family Medicine

## 2015-10-26 ENCOUNTER — Ambulatory Visit (INDEPENDENT_AMBULATORY_CARE_PROVIDER_SITE_OTHER): Payer: Managed Care, Other (non HMO) | Admitting: Family Medicine

## 2015-10-26 VITALS — BP 100/70 | HR 60 | Temp 97.7°F | Ht 68.5 in | Wt 174.2 lb

## 2015-10-26 DIAGNOSIS — Z0000 Encounter for general adult medical examination without abnormal findings: Secondary | ICD-10-CM

## 2015-10-26 DIAGNOSIS — R5383 Other fatigue: Secondary | ICD-10-CM

## 2015-10-26 DIAGNOSIS — Z13220 Encounter for screening for lipoid disorders: Secondary | ICD-10-CM | POA: Diagnosis not present

## 2015-10-26 DIAGNOSIS — Z125 Encounter for screening for malignant neoplasm of prostate: Secondary | ICD-10-CM | POA: Diagnosis not present

## 2015-10-26 LAB — TSH: TSH: 1.01 u[IU]/mL (ref 0.35–4.50)

## 2015-10-26 LAB — LIPID PANEL
CHOLESTEROL: 226 mg/dL — AB (ref 0–200)
HDL: 52.2 mg/dL (ref >39.00)
LDL Cholesterol: 143 mg/dL — ABNORMAL HIGH (ref 0–99)
NonHDL: 174.1
Total CHOL/HDL Ratio: 4
Triglycerides: 157 mg/dL — ABNORMAL HIGH (ref 0.0–149.0)
VLDL: 31.4 mg/dL (ref 0.0–40.0)

## 2015-10-26 LAB — HEPATIC FUNCTION PANEL
ALBUMIN: 4.3 g/dL (ref 3.5–5.2)
ALT: 16 U/L (ref 0–53)
AST: 14 U/L (ref 0–37)
Alkaline Phosphatase: 55 U/L (ref 39–117)
Bilirubin, Direct: 0.2 mg/dL (ref 0.0–0.3)
Total Bilirubin: 1.1 mg/dL (ref 0.2–1.2)
Total Protein: 6.2 g/dL (ref 6.0–8.3)

## 2015-10-26 LAB — BASIC METABOLIC PANEL
BUN: 14 mg/dL (ref 6–23)
CO2: 30 meq/L (ref 19–32)
CREATININE: 0.86 mg/dL (ref 0.40–1.50)
Calcium: 9.3 mg/dL (ref 8.4–10.5)
Chloride: 104 mEq/L (ref 96–112)
GFR: 99.9 mL/min (ref >60.00)
Glucose, Bld: 95 mg/dL (ref 70–99)
Potassium: 4.7 mEq/L (ref 3.5–5.1)
SODIUM: 140 meq/L (ref 135–145)

## 2015-10-26 LAB — PSA: PSA: 0.19 ng/mL (ref 0.10–4.00)

## 2015-10-26 NOTE — Progress Notes (Signed)
Pre visit review using our clinic review tool, if applicable. No additional management support is needed unless otherwise documented below in the visit note. 

## 2015-10-26 NOTE — Progress Notes (Signed)
Dr. Frederico Hamman T. Kiyah Demartini, MD, [facility] Primary [facility] and Sports Medicine [place], [address] Phone: [phone] Fax: [phone]  10/26/2015  Patient: Hunter Owens, MRN: [id number], DOB: [birth year], 49 y.o.  Primary Physician:  [name], MD   Chief Complaint  Patient presents with  . Annual Exam   Subjective:   Hunter Owens is a 49 y.o. pleasant patient who presents with the following:  Preventative Health Maintenance Visit:  Health Maintenance Summary Reviewed and updated, unless pt declines services.  Tobacco History Reviewed. Alcohol: No concerns, no excessive use Exercise Habits: Some activity, rec at least 30 mins 5 times a week STD concerns: no risk or activity to increase risk Drug Use: None Encouraged self-testicular check  Full bloodwork.   Custody over now - ongoing for > 10 years.   Intermittent back pain. Flexeril will help.   Health Maintenance  Topic Date Due  . INFLUENZA VACCINE  03/04/2016 (Originally 07/06/2015)  . TETANUS/TDAP  10/25/2016 (Originally 06/11/1984)  . HIV Screening  10/25/2020 (Originally 06/11/1980)  . COLONOSCOPY  12/03/2024    There is no immunization history on file for this patient. Patient Active Problem List   Diagnosis Date Noted  . Bacteremia due to Gram-positive bacteria 12/27/2013  . Liver abscess, 12/2013, resolved s/p drainage, IV ABX 12/26/2013  . Insomnia 08/09/2012  . Back pain 08/09/2012  . Allergic rhinitis due to pollen 08/09/2012   Past Medical History  Diagnosis Date  . Insomnia 08/09/2012  . Back pain 08/09/2012  . Allergic rhinitis due to pollen 08/09/2012  . Herniated disc   . Abscess of liver 12/2013    s/p drains placed at [place], IV ABX. Bacteremia.  Marland Kitchen Hx of bacterial food poisoning 09/14/13  . Status post PICC central line placement     removed 02/13/2014   Past Surgical History  Procedure Laterality Date  . Tonsillectomy  1974  . Wisdom tooth extraction  1985    Social History   Social History  . Marital Status: Married    Spouse Name: N/A  . Number of Children: N/A  . Years of Education: N/A   Occupational History  . Not on file.   Social History Main Topics  . Smoking status: Current Some Day Smoker    Types: Cigarettes  . Smokeless tobacco: Never Used  . Alcohol Use: 0.0 oz/week    0 Standard drinks or equivalent per week     Comment: social  . Drug Use: No  . Sexual Activity: Not on file   Other Topics Concern  . Not on file   Social History Narrative   Family History  Problem Relation Age of Onset  . Heart disease Mother   . Heart disease Maternal Grandmother   . Heart disease Maternal Grandfather    No Known Allergies  Medication list has been reviewed and updated.   General: Denies fever, chills, sweats. No significant weight loss. Eyes: Denies blurring,significant itching ENT: Denies earache, sore throat, and hoarseness. Cardiovascular: Denies chest pains, palpitations, dyspnea on exertion Respiratory: Denies cough, dyspnea at rest,wheeezing Breast: no concerns about lumps GI: Denies nausea, vomiting, diarrhea, constipation, change in bowel habits, abdominal pain, melena, hematochezia GU: Denies penile discharge, ED, urinary flow / outflow problems. No STD concerns. Musculoskeletal: occ back pain Derm: Denies rash, itching Neuro: Denies  paresthesias, frequent falls, frequent headaches Psych: Denies depression, anxiety Endocrine: Denies cold intolerance, heat intolerance, polydipsia Heme: Denies enlarged lymph nodes Allergy: No hayfever  Objective:  BP 100/70 mmHg  Pulse 60  Temp(Src) 97.7 F (36.5 C) (Oral)  Ht 5' 8.5" (1.74 m)  Wt 174 lb 4 oz (79.039 kg)  BMI 26.11 kg/m2 Ideal Body Weight: Weight in (lb) to have BMI = 25: 166.5  No exam data present  GEN: well developed, well nourished, no acute distress Eyes: conjunctiva and lids normal, PERRLA, EOMI ENT: TM clear, nares clear, oral exam  WNL Neck: supple, no lymphadenopathy, no thyromegaly, no JVD Pulm: clear to auscultation and percussion, respiratory effort normal CV: regular rate and rhythm, S1-S2, no murmur, rub or gallop, no bruits, peripheral pulses normal and symmetric, no cyanosis, clubbing, edema or varicosities GI: soft, non-tender; no hepatosplenomegaly, masses; active bowel sounds all quadrants GU: no hernia, testicular mass, penile discharge Lymph: no cervical, axillary or inguinal adenopathy MSK: gait normal, muscle tone and strength WNL, no joint swelling, effusions, discoloration, crepitus  SKIN: clear, good turgor, color WNL, no rashes, lesions, or ulcerations Neuro: normal mental status, normal strength, sensation, and motion Psych: alert; oriented to person, place and time, normally interactive and not anxious or depressed in appearance.  Labs pending   Assessment and Plan:   Healthcare maintenance  Screening PSA (prostate specific antigen) - Plan: PSA  Screening, lipid - Plan: Lipid panel  Other fatigue - Plan: Basic metabolic panel, CBC with Differential/Platelet, Hepatic function panel, TSH  Health Maintenance Exam: The patient's preventative maintenance and recommended screening tests for an annual wellness exam were reviewed in full today. Brought up to date unless services declined.  Counselled on the importance of diet, exercise, and its role in overall health and mortality. The patient's FH and SH was reviewed, including their home life, tobacco status, and drug and alcohol status.  Follow-up: No Follow-up on file. Unless noted, follow-up in 1 year for Health Maintenance Exam.  New Prescriptions   No medications on file   Modified Medications   No medications on file   Orders Placed This Encounter  Procedures  . Basic metabolic panel  . CBC with Differential/Platelet  . Hepatic function panel  . Lipid panel  . PSA  . TSH    Signed,  Frederico Hamman T. Essynce Munsch, MD   Patient's  Medications  New Prescriptions   No medications on file  Previous Medications   CYCLOBENZAPRINE (FLEXERIL) 5 MG TABLET    Take 1 tablet (5 mg total) by mouth as needed for muscle spasms.   ZOLPIDEM (AMBIEN) 10 MG TABLET    TAKE 1 TABLET BY MOUTH AS NEEDED FOR SLEEP  Modified Medications   No medications on file  Discontinued Medications   No medications on file

## 2015-10-27 LAB — CBC WITH DIFFERENTIAL/PLATELET
Basophils Absolute: 0 10*3/uL (ref 0.0–0.1)
Basophils Relative: 0.5 % (ref 0.0–3.0)
Eosinophils Absolute: 0.2 10*3/uL (ref 0.0–0.7)
Eosinophils Relative: 3 % (ref 0.0–5.0)
HEMATOCRIT: 51.1 % (ref 39.0–52.0)
Hemoglobin: 17.6 g/dL — ABNORMAL HIGH (ref 13.0–17.0)
LYMPHS PCT: 26.4 % (ref 12.0–46.0)
Lymphs Abs: 1.8 10*3/uL (ref 0.7–4.0)
MCHC: 34.5 g/dL (ref 30.0–36.0)
MCV: 95.6 fl (ref 78.0–100.0)
MONOS PCT: 7.1 % (ref 3.0–12.0)
Monocytes Absolute: 0.5 10*3/uL (ref 0.1–1.0)
Neutro Abs: 4.2 10*3/uL (ref 1.4–7.7)
Neutrophils Relative %: 63 % (ref 43.0–77.0)
Platelets: 160 10*3/uL (ref 150.0–400.0)
RBC: 5.34 Mil/uL (ref 4.22–5.81)
RDW: 13.6 % (ref 11.5–15.5)
WBC: 6.7 10*3/uL (ref 4.0–10.5)

## 2015-11-04 IMAGING — CR DG CHEST 2V
2 series · 2 of 2 positions shown · non-contrast
Comparison: None.

CLINICAL DATA: Weakness

EXAM:
CHEST  2 VIEW

[w chest lat]
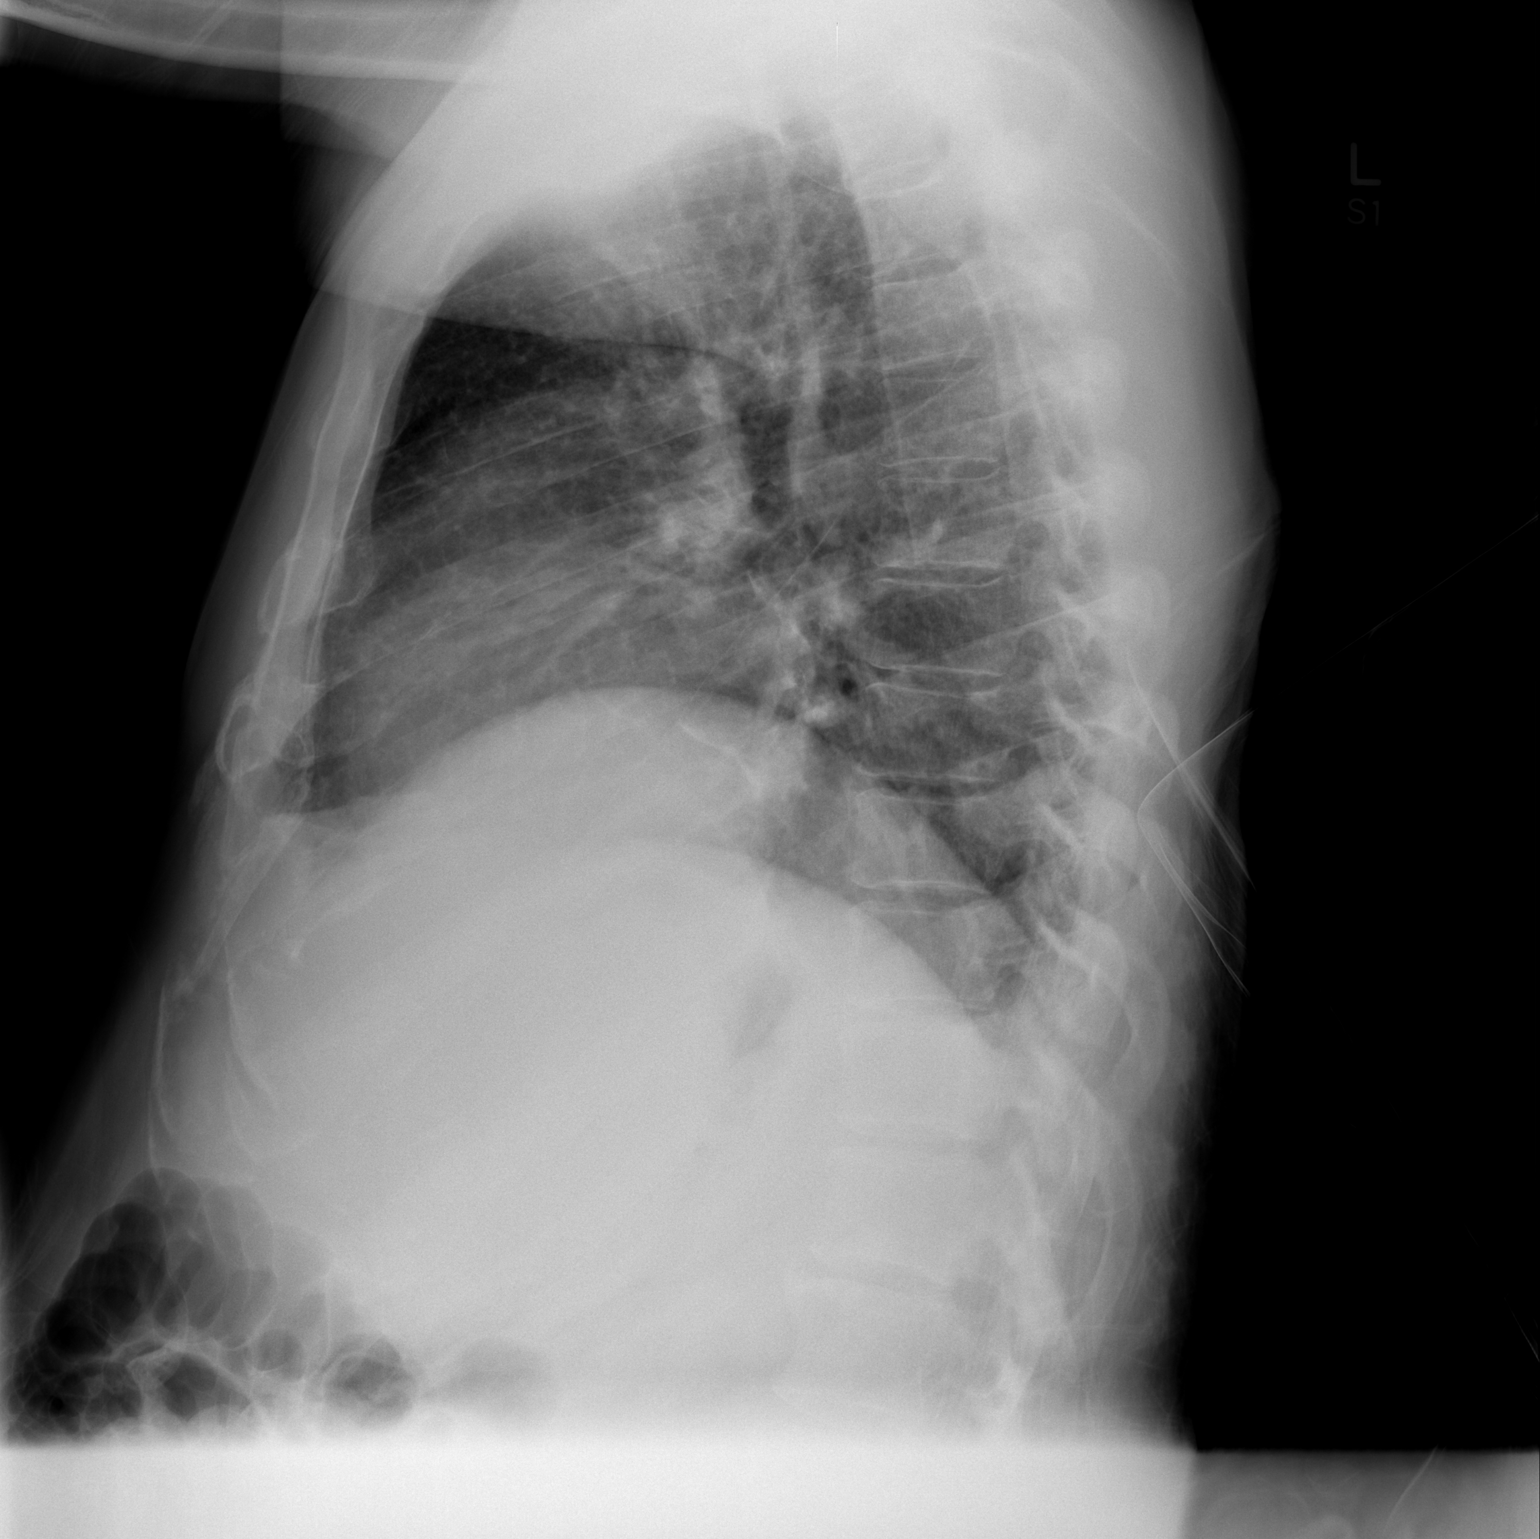

[w chest pa]
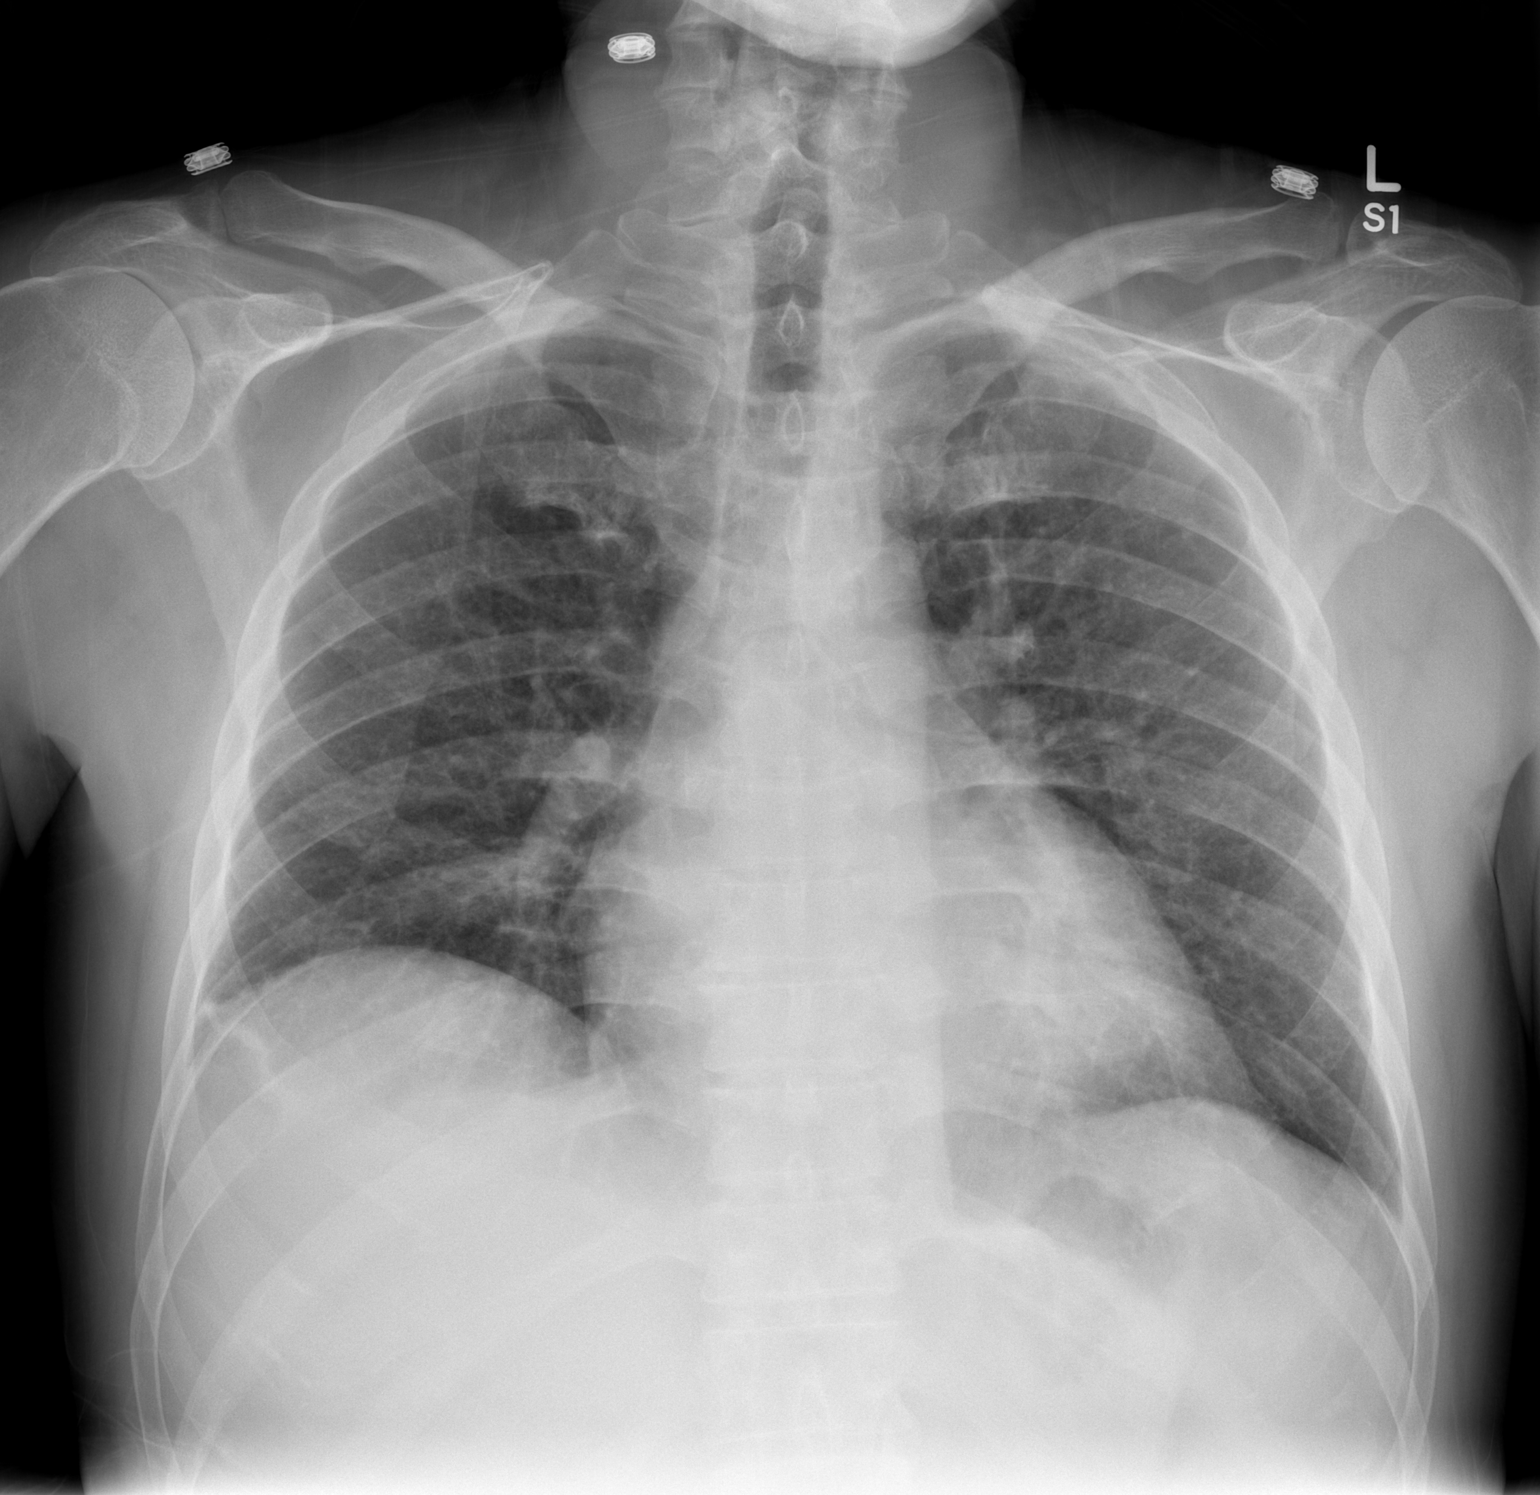

[2 of 2 positions shown; findings below may reference images not displayed]

FINDINGS: There is mild scarring in the lateral right base. Lungs are
otherwise clear. Heart size and pulmonary vascularity are normal. No
adenopathy. No bone lesions.
IMPRESSION: No edema or consolidation.

## 2016-04-13 ENCOUNTER — Ambulatory Visit (INDEPENDENT_AMBULATORY_CARE_PROVIDER_SITE_OTHER): Payer: Managed Care, Other (non HMO) | Admitting: Family Medicine

## 2016-04-13 ENCOUNTER — Encounter: Payer: Self-pay | Admitting: Family Medicine

## 2016-04-13 VITALS — BP 100/60 | HR 67 | Temp 98.3°F | Ht 68.5 in | Wt 177.5 lb

## 2016-04-13 DIAGNOSIS — R05 Cough: Secondary | ICD-10-CM | POA: Diagnosis not present

## 2016-04-13 DIAGNOSIS — R059 Cough, unspecified: Secondary | ICD-10-CM

## 2016-04-13 DIAGNOSIS — B349 Viral infection, unspecified: Secondary | ICD-10-CM | POA: Diagnosis not present

## 2016-04-13 MED ORDER — AZITHROMYCIN 250 MG PO TABS: ORAL_TABLET | ORAL | Status: DC

## 2016-04-13 MED ORDER — BENZONATATE 100 MG PO CAPS: 100.0000 mg | ORAL_CAPSULE | Freq: Three times a day (TID) | ORAL | Status: DC | PRN

## 2016-04-13 MED ORDER — HYDROCODONE-HOMATROPINE 5-1.5 MG/5ML PO SYRP: ORAL_SOLUTION | ORAL | Status: DC

## 2016-04-13 NOTE — Progress Notes (Signed)
Pre visit review using our clinic review tool, if applicable. No additional management support is needed unless otherwise documented below in the visit note. 

## 2016-04-13 NOTE — Progress Notes (Signed)
Dr. Frederico Hamman T. Katha Kuehne, MD, [facility] Primary Care and Sports Medicine [place], [address] Phone: [phone] Fax: [phone]  04/13/2016  Patient: Hunter Owens, MRN: [id number], DOB: 1966-03-19, 50 y.o.  Primary Physician:  [name], MD   Chief Complaint  Patient presents with  . Nasal Congestion    Stated Monday night  . Cough  . Sore Throat   Subjective:   Hunter Owens is a 50 y.o. very pleasant male patient who presents with the following:  Monday night, probably feeling bad and throat was sore and having a lot of congestion. Coughing a lot and sore throat and nasal congestion.   Leaving today to go to [place].  Then [place]  No abd pain or nausea, no diarrhea  Past Medical History, Surgical History, Social History, Family History, Problem List, Medications, and Allergies have been reviewed and updated if relevant.  Patient Active Problem List   Diagnosis Date Noted  . Bacteremia due to Gram-positive bacteria 12/27/2013  . Liver abscess, 12/2013, resolved s/p drainage, IV ABX 12/26/2013  . Insomnia 08/09/2012  . Back pain 08/09/2012  . Allergic rhinitis due to pollen 08/09/2012    Past Medical History  Diagnosis Date  . Insomnia 08/09/2012  . Back pain 08/09/2012  . Allergic rhinitis due to pollen 08/09/2012  . Herniated disc   . Abscess of liver 12/2013    s/p drains placed at [place], IV ABX. Bacteremia.  Marland Kitchen Hx of bacterial food poisoning 09/14/13  . Status post PICC central line placement     removed 02/13/2014    Past Surgical History  Procedure Laterality Date  . Tonsillectomy  1974  . Wisdom tooth extraction  1985    Social History   Social History  . Marital Status: Married    Spouse Name: N/A  . Number of Children: N/A  . Years of Education: N/A   Occupational History  . Not on file.   Social History Main Topics  . Smoking status: Current Some Day Smoker    Types: Cigarettes  . Smokeless tobacco:  Never Used  . Alcohol Use: 0.0 oz/week    0 Standard drinks or equivalent per week     Comment: social  . Drug Use: No  . Sexual Activity: Not on file   Other Topics Concern  . Not on file   Social History Narrative    Family History  Problem Relation Age of Onset  . Heart disease Mother   . Heart disease Maternal Grandmother   . Heart disease Maternal Grandfather     No Known Allergies  Medication list reviewed and updated in full in [place].  ROS: GEN: Acute illness details above GI: Tolerating PO intake GU: maintaining adequate hydration and urination Pulm: No SOB Interactive and getting along well at home.  Otherwise, ROS is as per the HPI.  Objective:   BP 100/60 mmHg  Pulse 67  Temp(Src) 98.3 F (36.8 C) (Oral)  Ht 5' 8.5" (1.74 m)  Wt 177 lb 8 oz (80.513 kg)  BMI 26.59 kg/m2   Gen: WDWN, NAD; A & O x3, cooperative. Pleasant.Globally Non-toxic HEENT: Normocephalic and atraumatic. Throat clear, w/o exudate, R TM clear, L TM - good landmarks, No fluid present. rhinnorhea.  MMM Frontal sinuses: NT Max sinuses: NT NECK: Anterior cervical  LAD is absent CV: RRR, No M/G/R, cap refill <2 sec PULM: Breathing comfortably in no respiratory distress. no wheezing, crackles, rhonchi EXT: No c/c/e PSYCH:  Friendly, good eye contact MSK: Nml gait     Laboratory and Imaging Data:  Assessment and Plan:   Viral syndrome  Cough  Probable virus, treat as such, supportive care.  Leaving the country for next 10 days - ok to hold abx for now, if develops fever or worsening pulm sx, ok to take.  Follow-up: No Follow-up on file.  New Prescriptions   AZITHROMYCIN (ZITHROMAX) 250 MG TABLET    2 tabs po on day 1, then 1 tab po for 4 days   BENZONATATE (TESSALON) 100 MG CAPSULE    Take 1 capsule (100 mg total) by mouth 3 (three) times daily as needed for cough.   HYDROCODONE-HOMATROPINE (HYCODAN) 5-1.5 MG/5ML SYRUP    1 tsp po at night before bed prn cough    Signed,  Kitiara Hintze T. Jeslie Lowe, MD   Patient's Medications  New Prescriptions   AZITHROMYCIN (ZITHROMAX) 250 MG TABLET    2 tabs po on day 1, then 1 tab po for 4 days   BENZONATATE (TESSALON) 100 MG CAPSULE    Take 1 capsule (100 mg total) by mouth 3 (three) times daily as needed for cough.   HYDROCODONE-HOMATROPINE (HYCODAN) 5-1.5 MG/5ML SYRUP    1 tsp po at night before bed prn cough  Previous Medications   CYCLOBENZAPRINE (FLEXERIL) 5 MG TABLET    Take 1 tablet (5 mg total) by mouth as needed for muscle spasms.   ZOLPIDEM (AMBIEN) 10 MG TABLET    TAKE 1 TABLET BY MOUTH AS NEEDED FOR SLEEP  Modified Medications   No medications on file  Discontinued Medications   No medications on file

## 2016-12-30 ENCOUNTER — Telehealth: Payer: Self-pay | Admitting: Family Medicine

## 2016-12-30 NOTE — Telephone Encounter (Signed)
Patient returned Dr. Coplands call. °

## 2016-12-30 NOTE — Telephone Encounter (Signed)
See daughter's chart for note.

## 2018-02-07 ENCOUNTER — Other Ambulatory Visit: Payer: Self-pay | Admitting: Family Medicine

## 2018-02-07 MED ORDER — OSELTAMIVIR PHOSPHATE 75 MG PO CAPS: 75.0000 mg | ORAL_CAPSULE | Freq: Every day | ORAL | 0 refills | Status: DC

## 2018-02-07 NOTE — Progress Notes (Signed)
Wife with influenza, proph. Tamiflu.

## 2019-06-02 ENCOUNTER — Encounter: Payer: Self-pay | Admitting: Family Medicine

## 2020-01-15 ENCOUNTER — Encounter: Payer: Self-pay | Admitting: Family Medicine

## 2020-01-15 MED ORDER — ZOLPIDEM TARTRATE 10 MG PO TABS: ORAL_TABLET | ORAL | 0 refills | Status: AC

## 2020-01-15 NOTE — Telephone Encounter (Signed)
Last office visit 04/13/2016 for Viral Syndrome.  Last refilled 09/07/2015 for #30 with 3 refills.

## 2020-01-15 NOTE — Telephone Encounter (Signed)
I know [place] well.  Given the circumstances, I think this is ok.  His wife told me that they are actively trying to find a MD in their new city.
